# Patient Record
Sex: Male | Born: 1960 | Race: Black or African American | Hispanic: No | Marital: Single | Smoking: Never smoker
Health system: Southern US, Community
[De-identification: ages and names within clinical notes are randomized; demographics above are authoritative.]

## PROBLEM LIST (undated history)

## (undated) DIAGNOSIS — E119 Type 2 diabetes mellitus without complications: Secondary | ICD-10-CM

## (undated) DIAGNOSIS — K219 Gastro-esophageal reflux disease without esophagitis: Secondary | ICD-10-CM

## (undated) HISTORY — PX: ROTATOR CUFF REPAIR: SHX139

## (undated) HISTORY — PX: JOINT REPLACEMENT: SHX530

---

## 2001-12-07 ENCOUNTER — Ambulatory Visit (HOSPITAL_BASED_OUTPATIENT_CLINIC_OR_DEPARTMENT_OTHER): Admission: RE | Admit: 2001-12-07 | Discharge: 2001-12-07 | Payer: Self-pay | Admitting: Orthopedic Surgery

## 2012-02-06 ENCOUNTER — Ambulatory Visit: Payer: Self-pay | Admitting: Orthopedic Surgery

## 2012-02-06 LAB — CBC
HCT: 40.9 % (ref 40.0–52.0)
HGB: 13.6 g/dL (ref 13.0–18.0)
MCHC: 33.1 g/dL (ref 32.0–36.0)
Platelet: 261 10*3/uL (ref 150–440)
RBC: 4.4 10*6/uL (ref 4.40–5.90)

## 2012-02-06 LAB — MRSA PCR SCREENING

## 2012-02-06 LAB — BASIC METABOLIC PANEL
Calcium, Total: 9.1 mg/dL (ref 8.5–10.1)
Chloride: 105 mmol/L (ref 98–107)
Co2: 27 mmol/L (ref 21–32)
Potassium: 4.3 mmol/L (ref 3.5–5.1)
Sodium: 138 mmol/L (ref 136–145)

## 2012-02-06 LAB — PROTIME-INR
INR: 0.9
Prothrombin Time: 12.9 secs (ref 11.5–14.7)

## 2012-02-06 LAB — APTT: Activated PTT: 31.2 secs (ref 23.6–35.9)

## 2012-02-23 ENCOUNTER — Inpatient Hospital Stay: Payer: Self-pay | Admitting: Orthopedic Surgery

## 2012-02-24 LAB — BASIC METABOLIC PANEL
Anion Gap: 7 (ref 7–16)
BUN: 11 mg/dL (ref 7–18)
Co2: 23 mmol/L (ref 21–32)
Creatinine: 1.14 mg/dL (ref 0.60–1.30)
EGFR (African American): >60
EGFR (Non-African Amer.): >60
Osmolality: 275 (ref 275–301)
Sodium: 136 mmol/L (ref 136–145)

## 2012-02-24 LAB — PLATELET COUNT: Platelet: 185 10*3/uL (ref 150–440)

## 2013-09-12 ENCOUNTER — Ambulatory Visit: Payer: Self-pay | Admitting: Orthopedic Surgery

## 2013-09-12 LAB — BASIC METABOLIC PANEL
ANION GAP: 6 — AB (ref 7–16)
BUN: 12 mg/dL (ref 7–18)
CHLORIDE: 102 mmol/L (ref 98–107)
CREATININE: 1.19 mg/dL (ref 0.60–1.30)
Calcium, Total: 9.1 mg/dL (ref 8.5–10.1)
Co2: 29 mmol/L (ref 21–32)
EGFR (African American): >60
EGFR (Non-African Amer.): >60
Glucose: 175 mg/dL — ABNORMAL HIGH (ref 65–99)
Osmolality: 278 (ref 275–301)
Potassium: 4.2 mmol/L (ref 3.5–5.1)
SODIUM: 137 mmol/L (ref 136–145)

## 2013-09-12 LAB — URINALYSIS, COMPLETE
BACTERIA: NONE SEEN
BLOOD: NEGATIVE
Bilirubin,UR: NEGATIVE
Glucose,UR: NEGATIVE mg/dL (ref 0–75)
Ketone: NEGATIVE
LEUKOCYTE ESTERASE: NEGATIVE
Nitrite: NEGATIVE
PROTEIN: NEGATIVE
Ph: 5 (ref 4.5–8.0)
RBC,UR: <1 /HPF (ref 0–5)
SPECIFIC GRAVITY: 1.028 (ref 1.003–1.030)
WBC UR: 1 /HPF (ref 0–5)

## 2013-09-12 LAB — CBC
HCT: 40.4 % (ref 40.0–52.0)
HGB: 13.3 g/dL (ref 13.0–18.0)
MCH: 31.1 pg (ref 26.0–34.0)
MCHC: 32.9 g/dL (ref 32.0–36.0)
MCV: 95 fL (ref 80–100)
Platelet: 243 10*3/uL (ref 150–440)
RBC: 4.27 10*6/uL — AB (ref 4.40–5.90)
RDW: 13.1 % (ref 11.5–14.5)
WBC: 6.9 10*3/uL (ref 3.8–10.6)

## 2013-09-12 LAB — APTT: Activated PTT: 29.9 secs (ref 23.6–35.9)

## 2013-09-12 LAB — PROTIME-INR
INR: 1
Prothrombin Time: 12.6 secs (ref 11.5–14.7)

## 2013-09-12 LAB — MRSA PCR SCREENING

## 2013-09-12 LAB — SEDIMENTATION RATE: ERYTHROCYTE SED RATE: 12 mm/h (ref 0–20)

## 2013-09-24 ENCOUNTER — Inpatient Hospital Stay: Payer: Self-pay | Admitting: Orthopedic Surgery

## 2013-09-25 LAB — BASIC METABOLIC PANEL
ANION GAP: 7 (ref 7–16)
BUN: 11 mg/dL (ref 7–18)
CALCIUM: 8.6 mg/dL (ref 8.5–10.1)
Chloride: 100 mmol/L (ref 98–107)
Co2: 27 mmol/L (ref 21–32)
Creatinine: 1.33 mg/dL — ABNORMAL HIGH (ref 0.60–1.30)
EGFR (African American): >60
EGFR (Non-African Amer.): >60
Glucose: 164 mg/dL — ABNORMAL HIGH (ref 65–99)
Osmolality: 271 (ref 275–301)
Potassium: 4.2 mmol/L (ref 3.5–5.1)
SODIUM: 134 mmol/L — AB (ref 136–145)

## 2013-09-25 LAB — HEMOGLOBIN: HGB: 11.3 g/dL — ABNORMAL LOW (ref 13.0–18.0)

## 2013-09-25 LAB — PLATELET COUNT: PLATELETS: 221 10*3/uL (ref 150–440)

## 2013-09-26 LAB — BASIC METABOLIC PANEL
ANION GAP: 5 — AB (ref 7–16)
BUN: 11 mg/dL (ref 7–18)
CALCIUM: 8.4 mg/dL — AB (ref 8.5–10.1)
Chloride: 102 mmol/L (ref 98–107)
Co2: 30 mmol/L (ref 21–32)
Creatinine: 1.37 mg/dL — ABNORMAL HIGH (ref 0.60–1.30)
EGFR (African American): >60
EGFR (Non-African Amer.): 59 — ABNORMAL LOW
GLUCOSE: 137 mg/dL — AB (ref 65–99)
OSMOLALITY: 275 (ref 275–301)
POTASSIUM: 4.3 mmol/L (ref 3.5–5.1)
Sodium: 137 mmol/L (ref 136–145)

## 2013-09-26 LAB — HEMOGLOBIN: HGB: 11.7 g/dL — ABNORMAL LOW (ref 13.0–18.0)

## 2013-09-27 LAB — PATHOLOGY REPORT

## 2014-07-08 NOTE — Discharge Summary (Signed)
PATIENT NAME:  Frank [name], THACKSTON MR#:  [id number] DATE OF BIRTH:  1960-12-11  DATE OF ADMISSION:  02/23/2012 DATE OF DISCHARGE:  02/26/2012  ADMITTING DIAGNOSIS: Left hip severe osteoarthritis.   DISCHARGE DIAGNOSIS: Left hip severe osteoarthritis.   PROCEDURE: Left total hip replacement.   SURGEON:  Leitha Schuller, M.D.   ASSISTANT: Cranston Neighbor, PA-C   ANESTHESIA: Spinal.  IMPLANTS: Medacta Versa-Fit cup DM, size 50-mm with a 50-mm DM liner, small 28-mm femoral head, and a #2 standard amis stem, hydroxy apatite coated.   ESTIMATED BLOOD LOSS: 325 mL with 125 mL returned.   URINE OUTPUT: 250 mL.   COMPLICATIONS: None.   SPECIMENS: Removed femoral head.   HISTORY: The patient is a 54 year old male who has had bilateral hip pain going on for at least two years. Left hip pain was more symptomatic than the right. Over the last couple of years the patient's pain has progressed to the point where he has pain with rest as well as with activity. He has had no relief with oral pain medication and anti-inflammatory medications. Pain is located in the left groin. Pain could develop to 8-9/10 with activity. He has been awakened at night by the left hip pain.     PHYSICAL EXAMINATION: GENERAL: Well developed, well nourished male in no apparent distress. Normal affect. Normal touch component in bilateral lower extremities. HEENT: Head is normocephalic, atraumatic. Pupils equal, round, reactive to light. No dentures or partials. HEART: Regular rate and rhythm. There is no murmur. There is a normal apical pulse. LUNGS: Clear to auscultation. There is no wheeze, rhonchi, or crackles. There is normal expansion of bilateral chest walls. LEFT HIP: Examination of the left hip shows the patient has 5-10 degrees of internal rotation of the left hip with 10 degrees of external rotation. He has a slight flexion contracture of the left hip. He has discomfort with internal and external rotation as well as hip  flexion. He is neurovascularly intact in the left lower extremity.   HOSPITAL COURSE: The patient was admitted to the hospital on 02/23/2012. He had surgery that same day and was brought to the orthopedic floor from the PAC-U unit in stable condition. On postoperative day one the patient's vitals and labs were monitored and remained stable. He did progress very well on postoperative day one, walking over 300 feet. By postoperative day two and postoperative day three, the patient continued to progress with physical therapy. His pain was significantly improving. His vitals remained stable. He was ready for discharge home with home physical therapy on 02/26/2012.   CONDITION AT DISCHARGE: Stable.   DISCHARGE INSTRUCTIONS:  The patient may gradually increase weight-bearing on the affected extremity. He is to wear thigh-high TED hose on both legs and remove at bedtime and replace when arising the next morning.   DIET: . He may resume a regular diet as tolerated.   MEDICATIONS: He may resume typical home medications. He should take Xarelto 10 mg every morning for 32 days.   PAIN MEDICATIONS: He should use Tylenol 650 to 1000 mg every six hours as needed for pain as well as oxycodone 5 to 10 mg every four hours as needed for pain.   WOUND CARE:  He is to apply an ice pack to the affected area. Do not get the dressing or bandage wet or dirty. He is to call [facility] orthopedics if the dressing gets water under it. Leave the dressing on.   He should call [facility]  orthopedics if any of the following occur: Bright red bleeding from the incision wound, fever above 101.5 degrees, redness, swelling, or drainage at the incision. [facility] if he experiences any leg pain, numbness or weakness in the legs, or bowel or bladder symptoms.   The patient is referred for physical therapy. He is to [facility] if a therapist has not contacted him within 48 hours of  his return home.   FOLLOWUP: The patient has a [facility] in two weeks. He should call to schedule an appointment.   DISCHARGE MEDICATIONS:  1. Oxycodone 5 mg 1 to 2 tablets every 4 to 6 hours as needed for pain.  2. Tylenol 650 to 1000 mg every 6 hours as needed for pain.  3. Xarelto 10 mg every morning once a day for 32 days.    ____________________________ Evon Slackhomas C. Gaines, PA-C tcg:bjt D: 02/26/2012 09:55:17 ET T: 02/26/2012 12:17:51 ET JOB#: [id number]  cc: Evon Slackhomas C. Gaines, PA-C, <Dictator> Evon SlackHOMAS C GAINES PA ELECTRONICALLY SIGNED 03/01/2012 10:00

## 2014-07-08 NOTE — Op Note (Signed)
PATIENT NAME:  Frank [name], Frank [name] MR#:  [id number] DATE OF BIRTH:  1960/08/18  DATE OF PROCEDURE:  02/23/2012  PREOPERATIVE DIAGNOSIS: Left hip severe osteoarthritis.   POSTOPERATIVE DIAGNOSIS: Left hip severe osteoarthritis.   PROCEDURE: Left total hip replacement.   SURGEON: Frank BuckerMichael Merikay Lesniewski, MD  ASSISTANT: Frank Neighborhris Gaines, PA-[name]  ANESTHESIA: Spinal.   IMPLANTS: Medacta Versa Fit cup DM size 50 mm liner with a 50 mm DM liner, small 28 mm femoral head, and a #2 standard Amis stem, hydroxyapatite coated.   DESCRIPTION OF PROCEDURE: The patient was brought to the Operating Room and, after adequate anesthesia was obtained, the right leg was placed in a well legholder and left leg in Medacta traction boot. The [name]-arm was brought in and initial x-rays obtained for subsequent comparison. After prepping and draping in the usual sterile manner, anterior approach was made lateral to the greater trochanter. The skin and subcutaneous tissue was incised followed by the fascia overlying the tensor. The tensor was retracted laterally and the deep fascia incised. The rectus fascia was then identified and incised and the rectus retracted medially. Femoral circumflex vessel was then coagulated. The anterior capsule was then exposed with a capsulotomy created with capsule flap lateral for subsequent placement of the modified Charnley retractor. The femoral neck cut was carried out at this point and the femoral head was removed. There was significant deformity found of the femoral head with extensive osteoarthritis. The acetabulum had similar appearance. The labrum was excised and sequential reaming carried out to 48 then 50 mm, which appeared appropriate. The 50 mm trial fit well and the 50 mm implant was impacted into place and was stable. Next, the posterior capsule was partially released and the leg brought in extension and external rotation. The lateral aspect of the trochanter was opened with a small box osteotome and  curette and sequential broaching was carried out up to a #2, which had a very tight fit. With the #2 trial being placed, component selection was carried out. The #2 stem was inserted down the canal with a short head and bipolar DM head, which was assembled on the back table. The hip was thoroughly irrigated and then reduced. X-rays showed good component position and equal leg lengths. The wound was again thoroughly irrigated and hemostasis checked with electrocautery. The tensor fascia was repaired with a heavy quill suture after infiltration of the soft tissues with a combination of morphine, Toradol, and 0.25% Sensorcaine with epinephrine, along with saline for dilution. A subcutaneous drain was inserted, 2-0 subcutaneous quill suture and skin staples. Xeroform, 4 x 4's, ABD, and tape were applied. The patient was sent to the recovery room in stable condition.   ESTIMATED BLOOD LOSS: 325 mL with 125 mL returned.   URINE OUTPUT: 250 mL.   COMPLICATIONS: None.   SPECIMEN: Removed femoral head. ____________________________ Frank [name]. [name], MD mjm:slb D: 02/23/2012 21:17:38 ET T: 02/24/2012 07:43:42 ET JOB#: [id number]  cc: Frank [name]. [name], MD, <Dictator> Frank [name] Frank Avino MD ELECTRONICALLY SIGNED 02/24/2012 8:26

## 2014-07-12 NOTE — Op Note (Signed)
PATIENT NAME:  Frank [name], Frank [name] MR#:  [id number] DATE OF BIRTH:  26-Jun-1960  DATE OF PROCEDURE:  09/24/2013  PREOPERATIVE DIAGNOSIS: Avascular necrosis and osteoarthritis, right hip.   POSTOPERATIVE DIAGNOSIS:  Avascular necrosis and osteoarthritis, right hip.   PROCEDURE: Right total hip replacement, anterior approach.   SURGEON:  Kennedy BuckerMichael Dmoni Fortson, MD   ASSISTANT:  Dedra Skeensodd Mundy   ANESTHESIA: Spinal.   DESCRIPTION OF PROCEDURE: The patient was brought to the operating room, and after adequate anesthesia was obtained, the patient was placed on the operative table with the left leg on a well-padded table, right foot in the Medacta attachment. [name]-arm was brought in and good visualization of the hip was obtained along with a preop x-ray. After prepping and draping in the usual sterile manner, appropriate patient identification and timeout procedures were completed. Incision was centered over the tensor fascia lata muscle and the greater trochanter.  Incision was carried down through the skin and subcutaneous tissue to the TFL fascia which was incised. The muscle was retracted laterally and deep retractor placed. The deep fascia was then incised, as well as the quadriceps fascia. The lateral femoral vessels were identified and ligated. The anterior capsule was then exposed and a capsulotomy created superolaterally.  Hohmann retractors were placed around the femoral neck and a femoral neck cut carried out with removal of the head. The head showed complete loss of articular cartilage over most of its surface. The labrum was excised. The acetabulum had significant degenerative changes as well. The acetabulum was then reamed up to 52 mm, at which point there was good bleeding bone. A 52 mm trial fit well and a 52 mm cup was impacted into position.  It was quite stable and appropriately placed. Next, the leg was externally rotated, pubofemoral and ischiofemoral releases carried out to allow for adequate external  rotation, and the leg was dropped into extension and adduction. Sequential broaching was carried out and trials made. The size 2 stem fit very well and this was the size on his opposite side. With trials, the medium gave the best fit. With lateral offset to restore offset with the more medialized acetabulum. The final components were assembled with the 2 laterals Amis stem inserted, followed by the M28 head with liner for the 52 mm Versafit cup DM. The components were impacted and the hip reduced. It was stable to 90 degrees external rotation, and postimplantation, [name]-arm view showed slightly increased length consistent with adding length secondary to his arthritis with appropriate offset. The wound was thoroughly irrigated. Then, 30 mL of 0.25% Sensorcaine with epinephrine was infiltrated into the incision. The tensor was repaired using a heavy quill, 2-0 quill subcutaneously, and staples closed the skin. Xeroform, 4 x 4's, ABD, and tape applied. The patient was sent to the recovery room in stable condition.   ESTIMATED BLOOD LOSS: 300 mL.   COMPLICATIONS: None.   SPECIMENS: Femoral head.   ____________________________ Leitha SchullerMichael J. Medrith Veillon, MD mjm:dd D: 09/24/2013 19:00:38 ET T: 09/24/2013 21:03:34 ET JOB#: [id number]  cc: Leitha SchullerMichael J. Chanley Mcenery, MD, <Dictator> Leitha SchullerMICHAEL J Genasis Zingale MD ELECTRONICALLY SIGNED 09/25/2013 6:08

## 2014-07-12 NOTE — Discharge Summary (Signed)
PATIENT NAME:  Frank [name], [name] MR#:  [id number] DATE OF BIRTH:  1960-11-08  DATE OF ADMISSION:  09/24/2013 DATE OF DISCHARGE:  09/27/2013  ADMITTING DIAGNOSIS: Avascular necrosis with osteoarthritis of the right hip.   DISCHARGE DIAGNOSIS: Avascular necrosis with osteoarthritis of the right hip.   OPERATION: On 09/24/2013, the patient had right total hip replacement using anterior approach.   SURGEON: Kennedy BuckerMichael Menz, M.D.   ASSISTANT: Dedra Skeensodd Atilla Zollner.   ANESTHESIA: Spinal.   ESTIMATED BLOOD LOSS: 300 mL.   COMPLICATIONS: None.   IMPLANTS USED: Two lateral Amis stems with M-28 head with liner for the 52 mm Versafit cup DM.  The patient was stabilized, brought to the recovery room, and then brought down to the orthopedic floor.   HISTORY: The patient is a 54 year old male who presented for continued pain involving his right hip. The patient has avascular necrosis and has been trying conservative management but has failed this. The patient has tried anti-inflammatories and activity modification. The patient is status post left total hip replacement done by Dr. Rosita KeaMenz on February 23, 2012 and has done quite well.   PHYSICAL EXAMINATION: GENERAL: Alert male with an antalgic gait on the right side with difficulties with transfers.  CARDIOVASCULAR: Regular rate and rhythm.  LUNGS: Clear to auscultation bilaterally.  MUSCULOSKELETAL: In regard to the right lower extremity, the patient has no gross deformity. The patient does have range of motion that is limited to 5 degrees external rotation and 3 degrees internal rotation with 70 degrees of flexion producing pain. The patient has weakness with 4/5 strength with straight leg raises and knee flexion.   HOSPITAL COURSE: After initial admission on September 24, 2013, the patient was brought to the orthopedic floor. On postop day 1, the patient's hemoglobin was at 11.3, went up to 11.7 on postop day 2. The patient did have a low-grade fever which was treated  with Tylenol. The patient seemed to break through this by postop day 3 and was ready to go home after doing physical therapy and ambulating 125 feet as well as doing the stairs.   DISCHARGE INSTRUCTIONS: The patient will [facility] in 10 days for staple removal. The patient will do physical therapy at home doing gait training and range of motion activity. The patient is weight-bear as tolerated. The patient's diet is regular.  The patient will try to keep his bandage on and have it changed as needed with physical therapy. The patient will use ice if there is any swelling. The patient will call the clinic if there is any bright red bleeding, calf pain, bowel or bladder difficulty, or any fever greater than 101.5.   DISCHARGE MEDICATIONS: Resume home medications and add Lovenox 40 mg subcu once a day for 14 days and then for pain control he will use oxycodone 5 mg 1 to 2 tablets q. 4 hours as needed for severe pain.  ____________________________ Shela CommonsJ. Dedra Skeensodd Sheyanne Munley, [place] jtm:sb D: 09/27/2013 07:07:23 ET T: 09/27/2013 07:22:22 ET JOB#: [id number]  cc: J. Dedra Skeensodd Ressie Slevin, [place]PA, <Dictator> J Yaniel Limbaugh [facility] PA ELECTRONICALLY SIGNED 10/05/2013 7:11

## 2020-05-22 ENCOUNTER — Other Ambulatory Visit: Payer: Self-pay | Admitting: Orthopedic Surgery

## 2020-06-08 ENCOUNTER — Encounter
Admission: RE | Admit: 2020-06-08 | Discharge: 2020-06-08 | Disposition: A | Discharge status: Home / Self Care | Payer: BC Managed Care – PPO | Source: Ambulatory Visit | Attending: Orthopedic Surgery | Admitting: Orthopedic Surgery

## 2020-06-08 ENCOUNTER — Other Ambulatory Visit: Payer: Self-pay

## 2020-06-08 HISTORY — DX: Gastro-esophageal reflux disease without esophagitis: K21.9

## 2020-06-08 NOTE — Patient Instructions (Signed)
Your procedure is scheduled on: Tuesday 06/16/20.  Report to THE FIRST FLOOR REGISTRATION DESK IN THE MEDICAL MALL ON THE MORNING OF SURGERY FIRST, THEN YOU WILL CHECK IN AT THE SURGERY INFORMATION DESK LOCATED OUTSIDE THE SAME DAY SURGERY DEPARTMENT LOCATED ON 2ND FLOOR MEDICAL MALL ENTRANCE.  To find out your arrival time please call [phone] between 1PM - 3PM on Monday 06/15/20.   Remember: Instructions that are not followed completely may result in serious medical risk, up to and including death, or upon the discretion of your surgeon and anesthesiologist your surgery may need to be rescheduled.     __X__ 1. Do not eat food after midnight the night before your procedure.                 No gum chewing or hard candies. You may drink SUGAR FREE clear liquids up to 2 hours                 before you are scheduled to arrive for your surgery- DO NOT drink clear                 liquids within 2 hours of the start of your surgery.                  __X__2.  On the morning of surgery brush your teeth with toothpaste and water, you may rinse your mouth with mouthwash if you wish.  Do not swallow any toothpaste or mouthwash.    __X__ 3.  No Alcohol for 24 hours before or after surgery.  __X__ 4.  Do Not Smoke or use e-cigarettes For 24 Hours Prior to Your Surgery.                 Do not use any chewable tobacco products for at least 6 hours prior to                 surgery.  __X__5.  Notify your doctor if there is any change in your medical condition      (cold, fever, infections).      Do NOT wear jewelry, make-up, hairpins, clips or nail polish. Do NOT wear lotions, powders, or perfumes.  Do NOT shave 48 hours prior to surgery. Men may shave face and neck. Do NOT bring valuables to the hospital.     [facility] is not responsible for any belongings or valuables.   Contacts, dentures/partials or body piercings may not be worn into surgery. Bring a case for your contacts, glasses or  hearing aids, a denture cup will be supplied.  Leave your suitcase in the car. After surgery it may be brought to your room.   For patients admitted to the hospital, discharge time is determined by your treatment team.   __X__ Take these medicines the morning of surgery with A SIP OF WATER:     1. NONE      __X__ Use CHG Soap as directed.  __X__ Stop Metformin 2 days prior to surgery. Your last dose will be on Saturday 06/13/20.    __X__ Stop Blood Thinners Coumadin Aspirin today.  __X__ Stop Anti-inflammatories 7 days before surgery such as Advil, Ibuprofen, Motrin, BC or Goodies Powder, Naprosyn, Naproxen, Aleve, Aspirin, Meloxicam. May take Tylenol if needed for pain or discomfort.   __X__Do not start taking any new herbal supplements or vitamins prior to your procedure.     Wear comfortable clothing (specific to your surgery type) to the hospital.  Plan for stool softeners for home use; pain medications have a tendency to cause constipation. You can also help prevent constipation by eating foods high in fiber such as fruits and vegetables and drinking plenty of fluids as your diet allows.  After surgery, you can prevent lung complications by doing breathing exercises.Take deep breaths and cough every 1-2 hours. Your doctor may order a device called an Incentive Spirometer to help you take deep breaths.  Please call the Pre-Admissions Testing Department at [phone] if you have any questions about these instructions.

## 2020-06-12 ENCOUNTER — Other Ambulatory Visit
Admission: RE | Admit: 2020-06-12 | Discharge: 2020-06-12 | Disposition: A | Discharge status: Home / Self Care | Payer: BC Managed Care – PPO | Source: Ambulatory Visit | Attending: Orthopedic Surgery | Admitting: Orthopedic Surgery

## 2020-06-12 ENCOUNTER — Other Ambulatory Visit: Payer: Self-pay

## 2020-06-12 DIAGNOSIS — Z20822 Contact with and (suspected) exposure to covid-19: Secondary | ICD-10-CM | POA: Insufficient documentation

## 2020-06-12 DIAGNOSIS — Z01818 Encounter for other preprocedural examination: Secondary | ICD-10-CM | POA: Diagnosis not present

## 2020-06-12 LAB — COMPREHENSIVE METABOLIC PANEL
ALT: 26 U/L (ref 0–44)
AST: 24 U/L (ref 15–41)
Albumin: 4.2 g/dL (ref 3.5–5.0)
Alkaline Phosphatase: 54 U/L (ref 38–126)
Anion gap: 8 (ref 5–15)
BUN: 14 mg/dL (ref 6–20)
CO2: 26 mmol/L (ref 22–32)
Calcium: 9.3 mg/dL (ref 8.9–10.3)
Chloride: 101 mmol/L (ref 98–111)
Creatinine, Ser: 1.06 mg/dL (ref 0.61–1.24)
GFR, Estimated: >60 mL/min (ref >60)
Glucose, Bld: 206 mg/dL — ABNORMAL HIGH (ref 70–99)
Potassium: 4.4 mmol/L (ref 3.5–5.1)
Sodium: 135 mmol/L (ref 135–145)
Total Bilirubin: 1 mg/dL (ref 0.3–1.2)
Total Protein: 7.4 g/dL (ref 6.5–8.1)

## 2020-06-12 LAB — CBC WITH DIFFERENTIAL/PLATELET
Abs Immature Granulocytes: 0.01 10*3/uL (ref 0.00–0.07)
Basophils Absolute: 0 10*3/uL (ref 0.0–0.1)
Basophils Relative: 0 %
Eosinophils Absolute: 0.1 10*3/uL (ref 0.0–0.5)
Eosinophils Relative: 1 %
HCT: 39.5 % (ref 39.0–52.0)
Hemoglobin: 12.9 g/dL — ABNORMAL LOW (ref 13.0–17.0)
Immature Granulocytes: 0 %
Lymphocytes Relative: 22 %
Lymphs Abs: 1.4 10*3/uL (ref 0.7–4.0)
MCH: 30.8 pg (ref 26.0–34.0)
MCHC: 32.7 g/dL (ref 30.0–36.0)
MCV: 94.3 fL (ref 80.0–100.0)
Monocytes Absolute: 0.4 10*3/uL (ref 0.1–1.0)
Monocytes Relative: 6 %
Neutro Abs: 4.4 10*3/uL (ref 1.7–7.7)
Neutrophils Relative %: 71 %
Platelets: 258 10*3/uL (ref 150–400)
RBC: 4.19 MIL/uL — ABNORMAL LOW (ref 4.22–5.81)
RDW: 11.8 % (ref 11.5–15.5)
WBC: 6.3 10*3/uL (ref 4.0–10.5)
nRBC: 0 % (ref 0.0–0.2)

## 2020-06-12 LAB — URINALYSIS, ROUTINE W REFLEX MICROSCOPIC
Bilirubin Urine: NEGATIVE
Glucose, UA: NEGATIVE mg/dL
Hgb urine dipstick: NEGATIVE
Ketones, ur: NEGATIVE mg/dL
Leukocytes,Ua: NEGATIVE
Nitrite: NEGATIVE
Protein, ur: NEGATIVE mg/dL
Specific Gravity, Urine: 1.01 (ref 1.005–1.030)
pH: 5 (ref 5.0–8.0)

## 2020-06-12 LAB — SARS CORONAVIRUS 2 (TAT 6-24 HRS): SARS Coronavirus 2: NEGATIVE

## 2020-06-12 LAB — SURGICAL PCR SCREEN
MRSA, PCR: NEGATIVE
Staphylococcus aureus: NEGATIVE

## 2020-06-13 LAB — TYPE AND SCREEN
ABO/RH(D): A POS
Antibody Screen: NEGATIVE

## 2020-06-15 MED ORDER — SODIUM CHLORIDE 0.9 % IV SOLN: INTRAVENOUS | Status: DC

## 2020-06-15 MED ORDER — FAMOTIDINE 20 MG PO TABS: 20.0000 mg | ORAL_TABLET | Freq: Once | ORAL | Status: AC

## 2020-06-15 MED ORDER — CEFAZOLIN SODIUM-DEXTROSE 2-4 GM/100ML-% IV SOLN
2.0000 g | INTRAVENOUS | Status: AC
  Administered 2020-06-16: 2 g via INTRAVENOUS

## 2020-06-15 MED ORDER — ORAL CARE MOUTH RINSE: 15.0000 mL | Freq: Once | OROMUCOSAL | Status: AC

## 2020-06-15 MED ORDER — CHLORHEXIDINE GLUCONATE 0.12 % MT SOLN: 15.0000 mL | Freq: Once | OROMUCOSAL | Status: AC

## 2020-06-16 ENCOUNTER — Encounter
Admission: RE | Disposition: A | Discharge status: Home Health | Payer: Self-pay | Source: Home / Self Care | Attending: Orthopedic Surgery

## 2020-06-16 ENCOUNTER — Encounter: Payer: Self-pay | Admitting: Orthopedic Surgery

## 2020-06-16 ENCOUNTER — Inpatient Hospital Stay: Payer: BC Managed Care – PPO

## 2020-06-16 ENCOUNTER — Inpatient Hospital Stay
Admission: RE | Admit: 2020-06-16 | Discharge: 2020-06-17 | DRG: 468 | Disposition: A | Discharge status: Home Health | Payer: BC Managed Care – PPO | Attending: Orthopedic Surgery | Admitting: Orthopedic Surgery

## 2020-06-16 ENCOUNTER — Other Ambulatory Visit: Payer: Self-pay

## 2020-06-16 DIAGNOSIS — G8918 Other acute postprocedural pain: Secondary | ICD-10-CM

## 2020-06-16 DIAGNOSIS — Z800 Family history of malignant neoplasm of digestive organs: Secondary | ICD-10-CM

## 2020-06-16 DIAGNOSIS — K219 Gastro-esophageal reflux disease without esophagitis: Secondary | ICD-10-CM | POA: Diagnosis present

## 2020-06-16 DIAGNOSIS — T84030A Mechanical loosening of internal right hip prosthetic joint, initial encounter: Secondary | ICD-10-CM | POA: Diagnosis present

## 2020-06-16 DIAGNOSIS — Z820 Family history of epilepsy and other diseases of the nervous system: Secondary | ICD-10-CM | POA: Diagnosis not present

## 2020-06-16 DIAGNOSIS — Z419 Encounter for procedure for purposes other than remedying health state, unspecified: Secondary | ICD-10-CM

## 2020-06-16 DIAGNOSIS — Z841 Family history of disorders of kidney and ureter: Secondary | ICD-10-CM | POA: Diagnosis not present

## 2020-06-16 DIAGNOSIS — Z833 Family history of diabetes mellitus: Secondary | ICD-10-CM | POA: Diagnosis not present

## 2020-06-16 DIAGNOSIS — E119 Type 2 diabetes mellitus without complications: Secondary | ICD-10-CM | POA: Diagnosis present

## 2020-06-16 DIAGNOSIS — Z7984 Long term (current) use of oral hypoglycemic drugs: Secondary | ICD-10-CM | POA: Diagnosis not present

## 2020-06-16 DIAGNOSIS — Y792 Prosthetic and other implants, materials and accessory orthopedic devices associated with adverse incidents: Secondary | ICD-10-CM | POA: Diagnosis present

## 2020-06-16 DIAGNOSIS — Z96642 Presence of left artificial hip joint: Secondary | ICD-10-CM | POA: Diagnosis present

## 2020-06-16 DIAGNOSIS — Z96649 Presence of unspecified artificial hip joint: Secondary | ICD-10-CM

## 2020-06-16 HISTORY — DX: Type 2 diabetes mellitus without complications: E11.9

## 2020-06-16 HISTORY — PX: TOTAL HIP REVISION: SHX763

## 2020-06-16 LAB — CBC
HCT: 36.3 % — ABNORMAL LOW (ref 39.0–52.0)
Hemoglobin: 11.8 g/dL — ABNORMAL LOW (ref 13.0–17.0)
MCH: 30.6 pg (ref 26.0–34.0)
MCHC: 32.5 g/dL (ref 30.0–36.0)
MCV: 94.3 fL (ref 80.0–100.0)
Platelets: 234 10*3/uL (ref 150–400)
RBC: 3.85 MIL/uL — ABNORMAL LOW (ref 4.22–5.81)
RDW: 11.9 % (ref 11.5–15.5)
WBC: 10.6 10*3/uL — ABNORMAL HIGH (ref 4.0–10.5)
nRBC: 0 % (ref 0.0–0.2)

## 2020-06-16 LAB — CREATININE, SERUM
Creatinine, Ser: 0.96 mg/dL (ref 0.61–1.24)
GFR, Estimated: >60 mL/min (ref >60)

## 2020-06-16 LAB — GLUCOSE, CAPILLARY
Glucose-Capillary: 166 mg/dL — ABNORMAL HIGH (ref 70–99)
Glucose-Capillary: 214 mg/dL — ABNORMAL HIGH (ref 70–99)
Glucose-Capillary: 212 mg/dL — ABNORMAL HIGH (ref 70–99)

## 2020-06-16 LAB — HEMOGLOBIN A1C
Hgb A1c MFr Bld: 6.4 % — ABNORMAL HIGH (ref 4.8–5.6)
Mean Plasma Glucose: 136.98 mg/dL

## 2020-06-16 LAB — ABO/RH: ABO/RH(D): A POS

## 2020-06-16 SURGERY — TOTAL HIP REVISION
Anesthesia: General | Site: Hip | Laterality: Right

## 2020-06-16 MED ORDER — SODIUM CHLORIDE 0.9 % IV SOLN
INTRAVENOUS | Status: DC | PRN
  Administered 2020-06-16: 60 mL

## 2020-06-16 MED ORDER — HYDROMORPHONE HCL 1 MG/ML IJ SOLN
INTRAMUSCULAR | Status: DC | PRN
  Administered 2020-06-16: .5 mg via INTRAVENOUS

## 2020-06-16 MED ORDER — CEFAZOLIN SODIUM-DEXTROSE 2-4 GM/100ML-% IV SOLN
2.0000 g | Freq: Four times a day (QID) | INTRAVENOUS | Status: AC
  Administered 2020-06-16 (×2): 2 g via INTRAVENOUS
  Filled 2020-06-16 (×2): qty 100

## 2020-06-16 MED ORDER — ONDANSETRON HCL 4 MG PO TABS: 4.0000 mg | ORAL_TABLET | Freq: Four times a day (QID) | ORAL | Status: DC | PRN

## 2020-06-16 MED ORDER — OXYCODONE HCL 5 MG PO TABS
5.0000 mg | ORAL_TABLET | ORAL | Status: DC | PRN
  Administered 2020-06-16: 5 mg via ORAL
  Filled 2020-06-16: qty 1

## 2020-06-16 MED ORDER — FENTANYL CITRATE (PF) 100 MCG/2ML IJ SOLN
INTRAMUSCULAR | Status: AC
  Filled 2020-06-16: qty 2

## 2020-06-16 MED ORDER — METFORMIN HCL 500 MG PO TABS
500.0000 mg | ORAL_TABLET | Freq: Every day | ORAL | Status: DC
  Administered 2020-06-17: 500 mg via ORAL
  Filled 2020-06-16: qty 1

## 2020-06-16 MED ORDER — METHOCARBAMOL 500 MG PO TABS: 500.0000 mg | ORAL_TABLET | Freq: Four times a day (QID) | ORAL | Status: DC | PRN

## 2020-06-16 MED ORDER — PROPOFOL 10 MG/ML IV BOLUS
INTRAVENOUS | Status: AC
  Filled 2020-06-16: qty 20

## 2020-06-16 MED ORDER — MIDAZOLAM HCL 2 MG/2ML IJ SOLN
INTRAMUSCULAR | Status: AC
  Filled 2020-06-16: qty 2

## 2020-06-16 MED ORDER — BUPIVACAINE-EPINEPHRINE (PF) 0.25% -1:200000 IJ SOLN
INTRAMUSCULAR | Status: DC | PRN
  Administered 2020-06-16: 30 mL

## 2020-06-16 MED ORDER — SODIUM CHLORIDE 0.9 % IV SOLN
INTRAVENOUS | Status: DC
  Administered 2020-06-16: 1000 mL via INTRAVENOUS

## 2020-06-16 MED ORDER — ZOLPIDEM TARTRATE 5 MG PO TABS: 5.0000 mg | ORAL_TABLET | Freq: Every evening | ORAL | Status: DC | PRN

## 2020-06-16 MED ORDER — METHOCARBAMOL 1000 MG/10ML IJ SOLN
500.0000 mg | Freq: Four times a day (QID) | INTRAVENOUS | Status: DC | PRN
  Filled 2020-06-16: qty 5

## 2020-06-16 MED ORDER — FENTANYL CITRATE (PF) 100 MCG/2ML IJ SOLN
INTRAMUSCULAR | Status: AC
  Administered 2020-06-16: 25 ug via INTRAVENOUS
  Filled 2020-06-16: qty 2

## 2020-06-16 MED ORDER — ACETAMINOPHEN 10 MG/ML IV SOLN
INTRAVENOUS | Status: AC
  Filled 2020-06-16: qty 100

## 2020-06-16 MED ORDER — ATORVASTATIN CALCIUM 20 MG PO TABS
40.0000 mg | ORAL_TABLET | Freq: Every day | ORAL | Status: DC
  Administered 2020-06-17: 40 mg via ORAL
  Filled 2020-06-16: qty 2

## 2020-06-16 MED ORDER — ONDANSETRON HCL 4 MG/2ML IJ SOLN
INTRAMUSCULAR | Status: AC
  Filled 2020-06-16: qty 2

## 2020-06-16 MED ORDER — HYDROMORPHONE HCL 1 MG/ML IJ SOLN
INTRAMUSCULAR | Status: AC
  Filled 2020-06-16: qty 1

## 2020-06-16 MED ORDER — MAGNESIUM HYDROXIDE 400 MG/5ML PO SUSP: 30.0000 mL | Freq: Every day | ORAL | Status: DC | PRN

## 2020-06-16 MED ORDER — ROCURONIUM BROMIDE 100 MG/10ML IV SOLN
INTRAVENOUS | Status: DC | PRN
  Administered 2020-06-16: 10 mg via INTRAVENOUS
  Administered 2020-06-16: 30 mg via INTRAVENOUS

## 2020-06-16 MED ORDER — FAMOTIDINE 20 MG PO TABS
ORAL_TABLET | ORAL | Status: AC
  Administered 2020-06-16: 20 mg via ORAL
  Filled 2020-06-16: qty 1

## 2020-06-16 MED ORDER — ONDANSETRON HCL 4 MG/2ML IJ SOLN
INTRAMUSCULAR | Status: DC | PRN
  Administered 2020-06-16: 4 mg via INTRAVENOUS

## 2020-06-16 MED ORDER — METOCLOPRAMIDE HCL 5 MG/ML IJ SOLN: 5.0000 mg | Freq: Three times a day (TID) | INTRAMUSCULAR | Status: DC | PRN

## 2020-06-16 MED ORDER — OXYCODONE HCL 5 MG PO TABS: 5.0000 mg | ORAL_TABLET | Freq: Once | ORAL | Status: DC | PRN

## 2020-06-16 MED ORDER — DIPHENHYDRAMINE HCL 12.5 MG/5ML PO ELIX
12.5000 mg | ORAL_SOLUTION | ORAL | Status: DC | PRN
  Filled 2020-06-16: qty 10

## 2020-06-16 MED ORDER — MIDAZOLAM HCL 2 MG/2ML IJ SOLN
INTRAMUSCULAR | Status: DC | PRN
  Administered 2020-06-16: 2 mg via INTRAVENOUS

## 2020-06-16 MED ORDER — PANTOPRAZOLE SODIUM 40 MG PO TBEC
40.0000 mg | DELAYED_RELEASE_TABLET | Freq: Every day | ORAL | Status: DC
  Administered 2020-06-17: 40 mg via ORAL
  Filled 2020-06-16: qty 1

## 2020-06-16 MED ORDER — FENTANYL CITRATE (PF) 100 MCG/2ML IJ SOLN
25.0000 ug | INTRAMUSCULAR | Status: AC | PRN
  Administered 2020-06-16 (×4): 25 ug via INTRAVENOUS

## 2020-06-16 MED ORDER — ALUM & MAG HYDROXIDE-SIMETH 200-200-20 MG/5ML PO SUSP: 30.0000 mL | ORAL | Status: DC | PRN

## 2020-06-16 MED ORDER — PROPOFOL 10 MG/ML IV BOLUS
INTRAVENOUS | Status: DC | PRN
  Administered 2020-06-16: 150 mg via INTRAVENOUS

## 2020-06-16 MED ORDER — TRANEXAMIC ACID-NACL 1000-0.7 MG/100ML-% IV SOLN
INTRAVENOUS | Status: AC
  Administered 2020-06-16: 1000 mg via INTRAVENOUS
  Filled 2020-06-16: qty 100

## 2020-06-16 MED ORDER — INFLUENZA VAC SPLIT QUAD 0.5 ML IM SUSY
0.5000 mL | PREFILLED_SYRINGE | INTRAMUSCULAR | Status: DC
  Filled 2020-06-16 (×2): qty 0.5

## 2020-06-16 MED ORDER — OXYCODONE HCL 5 MG PO TABS
10.0000 mg | ORAL_TABLET | ORAL | Status: DC | PRN
Start: 2020-06-16 — End: 2020-06-17

## 2020-06-16 MED ORDER — TRANEXAMIC ACID-NACL 1000-0.7 MG/100ML-% IV SOLN: 1000.0000 mg | Freq: Once | INTRAVENOUS | Status: AC

## 2020-06-16 MED ORDER — HYDROMORPHONE HCL 1 MG/ML IJ SOLN: 0.5000 mg | INTRAMUSCULAR | Status: DC | PRN

## 2020-06-16 MED ORDER — CHLORHEXIDINE GLUCONATE 0.12 % MT SOLN
OROMUCOSAL | Status: AC
  Administered 2020-06-16: 15 mL via OROMUCOSAL
  Filled 2020-06-16: qty 15

## 2020-06-16 MED ORDER — KETAMINE HCL 10 MG/ML IJ SOLN
INTRAMUSCULAR | Status: DC | PRN
  Administered 2020-06-16: 20 mg via INTRAVENOUS
  Administered 2020-06-16: 30 mg via INTRAVENOUS

## 2020-06-16 MED ORDER — SUCCINYLCHOLINE CHLORIDE 20 MG/ML IJ SOLN
INTRAMUSCULAR | Status: DC | PRN
  Administered 2020-06-16: 100 mg via INTRAVENOUS

## 2020-06-16 MED ORDER — ACETAMINOPHEN 500 MG PO TABS
1000.0000 mg | ORAL_TABLET | Freq: Four times a day (QID) | ORAL | Status: AC
  Administered 2020-06-16 – 2020-06-17 (×4): 1000 mg via ORAL
  Filled 2020-06-16 (×5): qty 2

## 2020-06-16 MED ORDER — METOCLOPRAMIDE HCL 10 MG PO TABS: 5.0000 mg | ORAL_TABLET | Freq: Three times a day (TID) | ORAL | Status: DC | PRN

## 2020-06-16 MED ORDER — ACETAMINOPHEN 10 MG/ML IV SOLN
INTRAVENOUS | Status: DC | PRN
  Administered 2020-06-16: 1000 mg via INTRAVENOUS

## 2020-06-16 MED ORDER — KETAMINE HCL 50 MG/5ML IJ SOSY
PREFILLED_SYRINGE | INTRAMUSCULAR | Status: AC
  Filled 2020-06-16: qty 5

## 2020-06-16 MED ORDER — PHENYLEPHRINE HCL (PRESSORS) 10 MG/ML IV SOLN
INTRAVENOUS | Status: DC | PRN
  Administered 2020-06-16: 100 ug via INTRAVENOUS

## 2020-06-16 MED ORDER — FENTANYL CITRATE (PF) 100 MCG/2ML IJ SOLN
INTRAMUSCULAR | Status: DC | PRN
  Administered 2020-06-16 (×4): 50 ug via INTRAVENOUS

## 2020-06-16 MED ORDER — SUGAMMADEX SODIUM 200 MG/2ML IV SOLN
INTRAVENOUS | Status: DC | PRN
  Administered 2020-06-16: 200 mg via INTRAVENOUS

## 2020-06-16 MED ORDER — ONDANSETRON HCL 4 MG/2ML IJ SOLN: 4.0000 mg | Freq: Once | INTRAMUSCULAR | Status: DC | PRN

## 2020-06-16 MED ORDER — DOCUSATE SODIUM 100 MG PO CAPS
100.0000 mg | ORAL_CAPSULE | Freq: Two times a day (BID) | ORAL | Status: DC
  Administered 2020-06-16 – 2020-06-17 (×2): 100 mg via ORAL
  Filled 2020-06-16 (×2): qty 1

## 2020-06-16 MED ORDER — LIDOCAINE HCL (CARDIAC) PF 100 MG/5ML IV SOSY
PREFILLED_SYRINGE | INTRAVENOUS | Status: DC | PRN
  Administered 2020-06-16: 100 mg via INTRAVENOUS

## 2020-06-16 MED ORDER — MAGNESIUM CITRATE PO SOLN
1.0000 | Freq: Once | ORAL | Status: DC | PRN
  Filled 2020-06-16: qty 296

## 2020-06-16 MED ORDER — CEFAZOLIN SODIUM-DEXTROSE 2-4 GM/100ML-% IV SOLN
INTRAVENOUS | Status: AC
  Filled 2020-06-16: qty 100

## 2020-06-16 MED ORDER — INSULIN ASPART 100 UNIT/ML ~~LOC~~ SOLN: 0.0000 [IU] | Freq: Three times a day (TID) | SUBCUTANEOUS | Status: DC

## 2020-06-16 MED ORDER — ONDANSETRON HCL 4 MG/2ML IJ SOLN: 4.0000 mg | Freq: Four times a day (QID) | INTRAMUSCULAR | Status: DC | PRN

## 2020-06-16 MED ORDER — PHENOL 1.4 % MT LIQD
1.0000 | OROMUCOSAL | Status: DC | PRN
  Filled 2020-06-16: qty 177

## 2020-06-16 MED ORDER — ACETAMINOPHEN 325 MG PO TABS: 325.0000 mg | ORAL_TABLET | Freq: Four times a day (QID) | ORAL | Status: DC | PRN

## 2020-06-16 MED ORDER — DEXAMETHASONE SODIUM PHOSPHATE 10 MG/ML IJ SOLN
INTRAMUSCULAR | Status: DC | PRN
  Administered 2020-06-16: 5 mg via INTRAVENOUS

## 2020-06-16 MED ORDER — TRAMADOL HCL 50 MG PO TABS
50.0000 mg | ORAL_TABLET | Freq: Four times a day (QID) | ORAL | Status: DC
Start: 2020-06-16 — End: 2020-06-17
  Administered 2020-06-16 – 2020-06-17 (×4): 50 mg via ORAL
  Filled 2020-06-16 (×5): qty 1

## 2020-06-16 MED ORDER — NEOMYCIN-POLYMYXIN B GU 40-200000 IR SOLN
Status: DC | PRN
  Administered 2020-06-16: 4 mL

## 2020-06-16 MED ORDER — OXYCODONE HCL 5 MG/5ML PO SOLN: 5.0000 mg | Freq: Once | ORAL | Status: DC | PRN

## 2020-06-16 MED ORDER — MENTHOL 3 MG MT LOZG
1.0000 | LOZENGE | OROMUCOSAL | Status: DC | PRN
  Filled 2020-06-16: qty 9

## 2020-06-16 MED ORDER — ASPIRIN EC 81 MG PO TBEC: 81.0000 mg | DELAYED_RELEASE_TABLET | ORAL | Status: DC

## 2020-06-16 MED ORDER — BISACODYL 10 MG RE SUPP
10.0000 mg | Freq: Every day | RECTAL | Status: DC | PRN
  Filled 2020-06-16: qty 1

## 2020-06-16 MED ORDER — LIDOCAINE HCL (PF) 2 % IJ SOLN
INTRAMUSCULAR | Status: AC
  Filled 2020-06-16: qty 5

## 2020-06-16 MED ORDER — ENOXAPARIN SODIUM 40 MG/0.4ML ~~LOC~~ SOLN
40.0000 mg | SUBCUTANEOUS | Status: DC
  Administered 2020-06-17: 40 mg via SUBCUTANEOUS
  Filled 2020-06-16: qty 0.4

## 2020-06-16 SURGICAL SUPPLY — 53 items
APL PRP STRL LF DISP 70% ISPRP (MISCELLANEOUS) ×1
BLADE SAGITTAL WIDE XTHICK NO (BLADE) IMPLANT
CANISTER SUCT 1200ML W/VALVE (MISCELLANEOUS) ×2 IMPLANT
CANISTER SUCT 3000ML PPV (MISCELLANEOUS) ×4 IMPLANT
CANISTER WOUND CARE 500ML ATS (WOUND CARE) ×2 IMPLANT
CHLORAPREP W/TINT 26 (MISCELLANEOUS) ×2 IMPLANT
COVER BACK TABLE REUSABLE LG (DRAPES) ×2 IMPLANT
COVER WAND RF STERILE (DRAPES) ×2 IMPLANT
DRAPE 3/4 80X56 (DRAPES) ×4 IMPLANT
DRAPE C-ARMOR (DRAPES) IMPLANT
DRAPE INCISE IOBAN 66X60 STRL (DRAPES) ×4 IMPLANT
ELECT BLADE 6.5 EXT (BLADE) ×2 IMPLANT
ELECT CAUTERY BLADE 6.4 (BLADE) ×2 IMPLANT
ELECT REM PT RETURN 9FT ADLT (ELECTROSURGICAL) ×2
ELECTRODE REM PT RTRN 9FT ADLT (ELECTROSURGICAL) ×1 IMPLANT
GLOVE SURG ORTHO LTX SZ8 (GLOVE) ×2 IMPLANT
GLOVE SURG SYN 9.0  PF PI (GLOVE) ×4
GLOVE SURG SYN 9.0 PF PI (GLOVE) ×2 IMPLANT
GLOVE SURG UNDER LTX SZ8 (GLOVE) ×2 IMPLANT
GLOVE SURG UNDER POLY LF SZ9 (GLOVE) ×2 IMPLANT
GOWN STRL REUS W/ TWL LRG LVL3 (GOWN DISPOSABLE) ×1 IMPLANT
GOWN STRL REUS W/ TWL XL LVL3 (GOWN DISPOSABLE) ×1 IMPLANT
GOWN STRL REUS W/TWL LRG LVL3 (GOWN DISPOSABLE) ×2
GOWN STRL REUS W/TWL XL LVL3 (GOWN DISPOSABLE) ×2
HEAD FEMORAL 28MM SZ S (Head) ×1 IMPLANT
HEMOSTAT SURGICEL 2X3 (HEMOSTASIS) ×4 IMPLANT
HOLDER FOLEY CATH W/STRAP (MISCELLANEOUS) ×2 IMPLANT
HOOD PEEL AWAY FLYTE STAYCOOL (MISCELLANEOUS) ×4 IMPLANT
IV NS 250ML (IV SOLUTION) ×2
IV NS 250ML BAXH (IV SOLUTION) IMPLANT
IV NS IRRIG 3000ML ARTHROMATIC (IV SOLUTION) ×2 IMPLANT
KIT PREVENA INCISION MGT 13 (CANNISTER) ×2 IMPLANT
KIT TURNOVER KIT A (KITS) ×2 IMPLANT
LINER DBL MOB SZ 0 52MM (Liner) ×1 IMPLANT
MANIFOLD NEPTUNE II (INSTRUMENTS) ×2 IMPLANT
NDL SAFETY ECLIPSE 18X1.5 (NEEDLE) ×1 IMPLANT
NEEDLE HYPO 18GX1.5 SHARP (NEEDLE) ×2
NS IRRIG 1000ML POUR BTL (IV SOLUTION) ×2 IMPLANT
PACK HIP PROSTHESIS (MISCELLANEOUS) ×2 IMPLANT
PULSAVAC PLUS IRRIG FAN TIP (DISPOSABLE) ×2
STAPLER SKIN PROX 35W (STAPLE) ×2 IMPLANT
STEM FEM STD CEMENTLESS SZ9 (Stem) ×1 IMPLANT
SUT DVC 2 QUILL PDO  T11 36X36 (SUTURE) ×2
SUT DVC 2 QUILL PDO T11 36X36 (SUTURE) ×1 IMPLANT
SUT ETHIBOND #5 BRAIDED 30INL (SUTURE) IMPLANT
SUT V-LOC 90 ABS DVC 3-0 CL (SUTURE) ×4 IMPLANT
SUT VIC AB 1 CT1 36 (SUTURE) ×2 IMPLANT
SYR 20ML LL LF (SYRINGE) ×2 IMPLANT
TIP FAN IRRIG PULSAVAC PLUS (DISPOSABLE) ×1 IMPLANT
TIP IRRIG/SUCT HIGH CAPACITY (MISCELLANEOUS) ×2 IMPLANT
TOWEL OR 17X26 4PK STRL BLUE (TOWEL DISPOSABLE) ×2 IMPLANT
TRAY FOLEY MTR SLVR 16FR STAT (SET/KITS/TRAYS/PACK) ×2 IMPLANT
WAND WEREWOLF FASTSEAL 6.0 (MISCELLANEOUS) ×1 IMPLANT

## 2020-06-16 NOTE — Anesthesia Procedure Notes (Signed)
Procedure Name: Intubation Date/Time: 06/16/2020 10:55 AM Performed by: Elton Sin, RN Pre-anesthesia Checklist: Patient identified, Emergency Drugs available, Suction available and Patient being monitored Patient Re-evaluated:Patient Re-evaluated prior to induction Oxygen Delivery Method: Circle system utilized Preoxygenation: Pre-oxygenation with 100% oxygen Induction Type: IV induction Ventilation: Mask ventilation without difficulty and Oral airway inserted - appropriate to patient size Laryngoscope Size: McGraph and 4 Grade View: Grade I Tube type: Oral Tube size: 7.5 mm Number of attempts: 1 Airway Equipment and Method: Stylet and Oral airway Placement Confirmation: ETT inserted through vocal cords under direct vision,  positive ETCO2 and breath sounds checked- equal and bilateral Secured at: 22 cm Tube secured with: Tape Dental Injury: Teeth and Oropharynx as per pre-operative assessment

## 2020-06-16 NOTE — Op Note (Signed)
06/16/2020  1:38 PM  PATIENT:  Frank [name]  59 y.o. 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  PROCEDURE:  Procedure(s): Right femoral stem revision (Right)  SURGEON: Leitha Schuller, MD  ASSISTANTS: None  ANESTHESIA:   general  EBL:  Total I/O In: 1100 [I.V.:1000; IV Piggyback:100] Out: 400 [Blood:400]  BLOOD ADMINISTERED:none  DRAINS: Incisional wound VAC   LOCAL MEDICATIONS USED:  MARCAINE    and OTHER Exparel  SPECIMEN:  No Specimen  DISPOSITION OF SPECIMEN:  N/A  COUNTS:  YES  TOURNIQUET:  * No tourniquets in log *  IMPLANTS: Medacta SMS 9 standard stem with ceramic S 28 mm head and 52 mm mpact bipolar liner  DICTATION: .Dragon Dictation patient was brought the operating room and after adequate general anesthesia was obtained the right foot was placed in the Medacta attachment and the hip prepped and draped you sterile fashion with appropriate patient identification and timeout procedure was completed.  Going through the prior scar skin and subcutaneous tissue incised in the tensor fascia incised there was scar tissue the tensor could be elevated off its fascia and retracted posteriorly the interval developed and the anterior capsule exposed with a capsulotomy performed and deep retractor placed.  The metal implant could be identified there is extensive scar tissue nothing that appeared infectious.  After excising scar tissue the and applying traction through the boot the head could be disassembled from the stem and the hip externally rotated posterior capsule release carried out to allow for extension of the leg and exposure of the proximal femur.  Scar tissue at the bone were removed around the lateral aspect of the stem to prevent fracture on removal of the stem femoral stem extractor  was attached and with impaction the stem came out with out difficulty.  The prior both bipolar head was then removed and sequential broaching carried out on the femoral side with the SMS system.  Up to with multiple trials being placed during the procedure and ended up being a size 9 it was very tight proximally and had good fill with S head trial fit well.  X-rays used during the procedure with C arm to assess pre and post procedure anatomy.  The after final trials the 9 SMS stem was impacted with the ceramic S 28 head attached to the 52 liner this was attached to the stem and then the hip was reduced without difficulty.  Fibrillar was placed along the medial neck and posterior capsule there was femoral bleeding from the scar tissue during the procedure.  The wound was thoroughly irrigated prior to this Exparel and Marcaine were infiltrated in the periarticular tissues throughout the case.  The fascia was repaired using a heavy Quill suture with 3 OV lock subcutaneously and skin staples followed by incisional wound VAC.  PLAN OF CARE: Admit to inpatient   PATIENT DISPOSITION:  PACU - hemodynamically stable.

## 2020-06-16 NOTE — Transfer of Care (Signed)
Immediate Anesthesia Transfer of Care Note  Patient: Frank [name]  Procedure(s) Performed: Right femoral stem revision (Right Hip)  Patient Location: PACU  Anesthesia Type:General  Level of Consciousness: awake  Airway & Oxygen Therapy: Patient Spontanous Breathing and Patient connected to face mask oxygen  Post-op Assessment: Report given to RN and Post -op Vital signs reviewed and stable  Post vital signs: Reviewed and stable  Last Vitals:  Vitals Value Taken Time  BP 131/71 06/16/20 1333  Temp    Pulse 80 06/16/20 1338  Resp 9 06/16/20 1338  SpO2 100 % 06/16/20 1338  Vitals shown include unvalidated device data.  Last Pain:  Vitals:   06/16/20 0840  TempSrc: Temporal  PainSc: 8          Complications: No complications documented.

## 2020-06-16 NOTE — Anesthesia Preprocedure Evaluation (Addendum)
Anesthesia Evaluation  Patient identified by MRN, date of birth, ID band Patient awake    Reviewed: Allergy & Precautions, H&P , NPO status , Patient's Chart, lab work & pertinent test results  History of Anesthesia Complications Negative for: history of anesthetic complications  Airway Mallampati: II  TM Distance: >3 FB Neck ROM: full    Dental  (+) Teeth Intact   Pulmonary neg pulmonary ROS, neg sleep apnea, neg COPD,    breath sounds clear to auscultation       Cardiovascular (-) angina(-) Past MI and (-) Cardiac Stents negative cardio ROS  (-) dysrhythmias  Rhythm:regular Rate:Normal     Neuro/Psych negative neurological ROS  negative psych ROS   GI/Hepatic Neg liver ROS, GERD  ,  Endo/Other  diabetes (on metformin), Type 2  Renal/GU      Musculoskeletal   Abdominal   Peds  Hematology negative hematology ROS (+)   Anesthesia Other Findings Past Medical History: No date: Diabetes mellitus without complication (HCC)     Comment:  type II No date: GERD (gastroesophageal reflux disease)  Past Surgical History: No date: JOINT REPLACEMENT No date: ROTATOR CUFF REPAIR  BMI    Body Mass Index: 23.18 kg/m      Reproductive/Obstetrics negative OB ROS                            Anesthesia Physical Anesthesia Plan  ASA: II  Anesthesia Plan: General ETT   Post-op Pain Management:    Induction:   PONV Risk Score and Plan: Ondansetron, Dexamethasone, Treatment may vary due to age or medical condition and Midazolam  Airway Management Planned:   Additional Equipment:   Intra-op Plan:   Post-operative Plan:   Informed Consent: I have reviewed the patients History and Physical, chart, labs and discussed the procedure including the risks, benefits and alternatives for the proposed anesthesia with the patient or authorized representative who has indicated his/her understanding and  acceptance.     Dental Advisory Given  Plan Discussed with: Anesthesiologist, CRNA and Surgeon  Anesthesia Plan Comments:         Anesthesia Quick Evaluation

## 2020-06-16 NOTE — H&P (Addendum)
Chief Complaint  Patient presents with  . Follow-up  S/P Bilateral THA, AVN   History of the Present Illness: Frank [name] is a 60 y.o. male here today.   The patient presents for evaluation of bilateral hip pain. The patient underwent bilateral total hip arthroplasties several years ago, right total hip arthroplasty in 2015, and left total hip arthroplasty in 2016, and had avascular necrosis. He comes in for evaluation. He has seen a physician at [facility]. He now has pain that is 8 out of 10. He has had evaluation with lab work at [facility] that were negative for infection, and negative cultures.  The patient states his left hip pain is not too bad.  The patient has diabetes, and his A1c was within normal limits. He takes metformin.  The patient is employed at [employer].   I have reviewed past medical, surgical, social and family history, and allergies as documented in the EMR.  Past Medical History: Past Medical History:  Diagnosis Date  . Osteoarthritis  Bilateral hips.  . Shingles   Past Surgical History: Past Surgical History:  Procedure Laterality Date  . COLONOSCOPY 2012  . JOINT REPLACEMENT Left 02/23/2012  Left hip total hip replacement.  . Right rotator cuff repair  . Right total hip replacement, anterior approach Right 09/24/13   Past Family History: Family History  Problem Relation Age of Onset  . Diabetes type II Mother  . Colon cancer Father  . Diabetes type II Sister  . Seizures Sister  . Kidney disease Sister  . Diabetes type II Brother   Medications: Current Outpatient Medications Ordered in Epic  Medication Sig Dispense Refill  . acetaminophen (TYLENOL) 325 MG tablet Take 1 tablet by mouth every 4 (four) hours as needed  . aspirin 81 MG EC tablet Take 81 mg by mouth once daily.  Marland Kitchen atorvastatin (LIPITOR) 40 MG tablet Take 1 tablet by mouth once daily  . HYDROcodone-acetaminophen (NORCO) 5-325 mg tablet Take 1 tablet by mouth every 6  (six) hours as needed for Pain. 60 tablet 0  . meloxicam (MOBIC) 7.5 MG tablet Take 7.5 mg by mouth 2 (two) times daily.  . metFORMIN (GLUCOPHAGE) 500 MG tablet Take 1 tablet by mouth 2 (two) times daily   No current Epic-ordered facility-administered medications on file.   Allergies: No Known Allergies   Body mass index is 22.53 kg/m.  Review of Systems: A comprehensive 14 point ROS was performed, reviewed, and the pertinent orthopaedic findings are documented in the HPI.  There were no vitals filed for this visit.   General Physical Examination:   General/Constitutional: No apparent distress: well-nourished and well developed. Eyes: Pupils equal, round with synchronous movement. Lungs: Clear to auscultation HEENT: Normal Vascular: No edema, swelling or tenderness, except as noted in detailed exam. Cardiac: Heart rate and rhythm is regular. Integumentary: No impressive skin lesions present, except as noted in detailed exam. Neuro/Psych: Normal mood and affect, oriented to person, place and time.  Musculoskeletal Examination:  On exam, the patient has an antalgic gait. Right hip range of motion of 0-20 degrees, limited by pain.  Radiographs:  AP pelvis and lateral x-rays of the right hip were ordered and personally reviewed today. These show gross loosening of right hip stem. There is obvious potting distally with pedestal formation, and obvious micromotion causing defect in the proximal femur. Both acetabulum look okay. Less bony ingrowth on the left than the right, and there is some evidence of potting on the  left as well, but less severe.  X-ray Impression Bilateral stem pedestal formation and potting on the right hip.  Assessment: ICD-10-CM  1. 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 Avascular necrosis, right hip M87.00  3. 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 AVN (avascular necrosis of bone)  (CMS-HCC) M87.00   Plan:  The patient has clinical findings of bilateral stem pedestal formation and potting on the right hip.  We discussed the patient's x-ray findings. I explained his right hip has gross loosening of the stem. I recommend right hip revision. I explained the surgery and postoperative course in detail..  Surgery scheduled for end of March.  Surgical Risks:  The nature of the condition and the proposed procedure has been reviewed in detail with the patient. Surgical versus non-surgical options and prognosis for recovery have been reviewed and the inherent risks and benefits of each have been discussed including the risks of infection, bleeding, injury to nerves/blood vessels/tendons, incomplete relief of symptoms, persisting pain and/or stiffness, loss of function, complex regional pain syndrome, failure of the procedure, as appropriate.  Attestation: I, Dawn Royse, am documenting for [employer], MD utilizing Nuance DAX.     Electronically signed by Marlena Clipper, MD at 06/08/20  Reviewed  H+P. No changes noted.

## 2020-06-17 ENCOUNTER — Encounter: Payer: Self-pay | Admitting: Orthopedic Surgery

## 2020-06-17 LAB — CBC
HCT: 30.3 % — ABNORMAL LOW (ref 39.0–52.0)
Hemoglobin: 9.8 g/dL — ABNORMAL LOW (ref 13.0–17.0)
MCH: 30.6 pg (ref 26.0–34.0)
MCHC: 32.3 g/dL (ref 30.0–36.0)
MCV: 94.7 fL (ref 80.0–100.0)
Platelets: 203 10*3/uL (ref 150–400)
RBC: 3.2 MIL/uL — ABNORMAL LOW (ref 4.22–5.81)
RDW: 11.9 % (ref 11.5–15.5)
WBC: 8.9 10*3/uL (ref 4.0–10.5)
nRBC: 0 % (ref 0.0–0.2)

## 2020-06-17 LAB — BASIC METABOLIC PANEL
Anion gap: 5 (ref 5–15)
BUN: 13 mg/dL (ref 6–20)
CO2: 24 mmol/L (ref 22–32)
Calcium: 8.4 mg/dL — ABNORMAL LOW (ref 8.9–10.3)
Chloride: 106 mmol/L (ref 98–111)
Creatinine, Ser: 1.01 mg/dL (ref 0.61–1.24)
GFR, Estimated: >60 mL/min (ref >60)
Glucose, Bld: 148 mg/dL — ABNORMAL HIGH (ref 70–99)
Potassium: 4 mmol/L (ref 3.5–5.1)
Sodium: 135 mmol/L (ref 135–145)

## 2020-06-17 LAB — GLUCOSE, CAPILLARY
Glucose-Capillary: 172 mg/dL — ABNORMAL HIGH (ref 70–99)
Glucose-Capillary: 139 mg/dL — ABNORMAL HIGH (ref 70–99)

## 2020-06-17 MED ORDER — ENOXAPARIN SODIUM 40 MG/0.4ML ~~LOC~~ SOLN: 40.0000 mg | SUBCUTANEOUS | 0 refills | Status: AC

## 2020-06-17 MED ORDER — TRAMADOL HCL 50 MG PO TABS: 50.0000 mg | ORAL_TABLET | Freq: Four times a day (QID) | ORAL | 0 refills | Status: AC | PRN

## 2020-06-17 MED ORDER — OXYCODONE HCL 5 MG PO TABS: 5.0000 mg | ORAL_TABLET | ORAL | 0 refills | Status: AC | PRN

## 2020-06-17 MED ORDER — METHOCARBAMOL 500 MG PO TABS: 500.0000 mg | ORAL_TABLET | Freq: Four times a day (QID) | ORAL | 0 refills | Status: AC | PRN

## 2020-06-17 NOTE — Progress Notes (Signed)
Met with the patient at the bedside He has a rolling walker at home He is agreeable to go to outpatient PT No HH agency will accept due to being in [facility] or the high copay with [employer] The patient is aware Made referral to Outpatient PT, they will call with an appointment

## 2020-06-17 NOTE — Progress Notes (Signed)
Physical Therapy Treatment Patient Details Name: Frank [name] MRN: [id number] DOB: [birth year] Today's Date: 06/17/2020    History of Present Illness Pt is a 60 y.o. 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    PT Comments    Pt resting in recliner upon PT arrival; agreeable to PT session.  Pt modified independent with transfers; modified independent with ambulation 250 feet with RW; and CGA navigating steps with UE support.  Pt steady and safe with functional mobility during noted sessions activities.  Pain 5/10 R hip beginning of session but 4/10 at rest end of session.  Pt appearing safe to discharge home in terms of functional mobility; nurse notified.  Plan for OP PT set-up and discharge today.    Follow Up Recommendations  Outpatient PT     Equipment Recommendations   (pt has RW and BSC at home already)    Recommendations for Other Services OT consult     Precautions / Restrictions Precautions Precautions: Anterior Hip;Fall Precaution Booklet Issued: Yes (comment) Precaution Comments: wound vac Restrictions Weight Bearing Restrictions: Yes RLE Weight Bearing: Weight bearing as tolerated    Mobility  Bed Mobility                Transfers Overall transfer level: Modified independent Equipment used: Rolling walker (2 wheeled) Transfers: Sit to/from [employer] Sit to Stand: Modified independent (Device/Increase time) Stand pivot transfers: Modified independent (Device/Increase time)       General transfer comment: pt still using a lot of UE support for transfers (limiting hip flexion during transitions d/t pain) but pt performing it safely  Ambulation/Gait Ambulation/Gait assistance: Modified independent (Device/Increase time) Gait Distance (Feet): 250 Feet Assistive device: Rolling walker (2 wheeled)   Gait  velocity: mildly decreased   General Gait Details: mildly antalgic; mild decreased stance time R LE; partial step through gait pattern; steady   Stairs Stairs: Yes Stairs assistance: Min guard Stair Management: One rail Left;Step to pattern;Forwards Number of Stairs:  (4x2 trials) General stair comments: ascended 4 steps x2 with L railing step to pattern forward facing; descended 4 steps with B railings step to pattern (pt unable to perform single rail with single UE support); descended 4 steps with single railing step to pattern (B UE support) safely   Wheelchair Mobility    Modified Rankin (Stroke Patients Only)       Balance Overall balance assessment: Needs assistance Sitting-balance support: No upper extremity supported;Feet supported Sitting balance-Leahy Scale: Normal Sitting balance - Comments: steady sitting reaching outside BOS   Standing balance support: No upper extremity supported Standing balance-Leahy Scale: Good Standing balance comment: steady standing reaching within BOS                            Cognition Arousal/Alertness: Awake/alert Behavior During Therapy: WFL for tasks assessed/performed Overall Cognitive Status: Within Functional Limits for tasks assessed                                        Exercises    General Comments General comments (skin integrity, edema, etc.): wound vac in place.  Pt agreeable to PT session.      Pertinent Vitals/Pain Pain Assessment: 0-10 Pain Score: 4  Pain Location: R hip Pain Descriptors / Indicators: Sore Pain Intervention(s): Limited activity within patient's tolerance;Monitored during session;Premedicated before session;Repositioned;Ice applied  Vitals (HR and O2 on room air) stable and WFL throughout treatment session.    Home Living Family/patient expects to be discharged to:: Private residence Living Arrangements: Other relatives (pt's mother; pt's sister plans to stay with  after surgery to assist) Available Help at Discharge: Family Type of Home: House Home Access: Stairs to enter Entrance Stairs-Rails: Left Home Layout: One level Home Equipment: Environmental consultant - 2 wheels;Cane - single point;Bedside commode      Prior Function Level of Independence: Independent with assistive device(s)      Comments: Modified independent ambulating with SPC; works at [employer]; no falls in past 6 months   PT Goals (current goals can now be found in the care plan section) Acute Rehab PT Goals Patient Stated Goal: To return home PT Goal Formulation: With patient Time For Goal Achievement: 07/01/20 Potential to Achieve Goals: Good Progress towards PT goals: Progressing toward goals    Frequency    BID      PT Plan Current plan remains appropriate    Co-evaluation              AM-PAC PT "6 Clicks" Mobility   Outcome Measure  Help needed turning from your back to your side while in a flat bed without using bedrails?: None Help needed moving from lying on your back to sitting on the side of a flat bed without using bedrails?: None Help needed moving to and from a bed to a chair (including a wheelchair)?: None Help needed standing up from a chair using your arms (e.g., wheelchair or bedside chair)?: None Help needed to walk in hospital room?: None Help needed climbing 3-5 steps with a railing? : A Little 6 Click Score: 23    End of Session Equipment Utilized During Treatment: Gait belt Activity Tolerance: Patient tolerated treatment well Patient left: in chair;with call bell/phone within reach;with chair alarm set;with SCD's reapplied;Other (comment) (B heels floating via pillow support) Nurse Communication: Mobility status;Precautions;Weight bearing status PT Visit Diagnosis: Other abnormalities of gait and mobility (R26.89);Muscle weakness (generalized) (M62.81);Pain Pain - Right/Left: Right Pain - part of body: Hip     Time: 8938-1017 PT Time  Calculation (min) (ACUTE ONLY): 23 min  Charges:  $Gait Training: 23-37 mins                    [employer], PT 06/17/20, 2:30 PM

## 2020-06-17 NOTE — Evaluation (Signed)
Occupational Therapy Evaluation Patient Details Name: Frank [name] MRN: [id number] DOB: [birth year] Today's Date: 06/17/2020    History of Present Illness Pt is a 60 y.o. 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   Clinical Impression   Mr Soules was seen for OT evaluation this date. Prior to hospital admission, pt was MOD I for mobility and ADLs using SPC, pt works at [employer]. Pt lives c his mother in 1 level home with 4 STE, reports his sister will stay with him after discharge. Pt presents to acute OT demonstrating impaired ADL performance and functional mobility 2/2 decreased activity tolerance and functional strength/ROM deficits. Pt currently requires MOD A for compression stockings mgmt seated EOB. SBA + RW for standing grooming tasks and toilet t/f - inclusing reaching outside BOS with no LOBs. Pt would benefit from skilled OT to address noted impairments and functional limitations (see below for any additional details) in order to maximize safety and independence while minimizing falls risk and caregiver burden. Upon hospital discharge, anticipate no OT follow up required.     Follow Up Recommendations  No OT follow up;Supervision - Intermittent    Equipment Recommendations  None recommended by OT    Recommendations for Other Services       Precautions / Restrictions Precautions Precautions: Anterior Hip;Fall Precaution Booklet Issued: Yes (comment) Precaution Comments: wound vac Restrictions Weight Bearing Restrictions: Yes RLE Weight Bearing: Weight bearing as tolerated      Mobility Bed Mobility Overal bed mobility: Modified Independent             General bed mobility comments: Supine to/from sit with mild increased effort/time to perform on own    Transfers Overall transfer level: Needs assistance Equipment used: Rolling walker (2  wheeled) Transfers: Sit to/from Stand Sit to Stand: Supervision          Balance Overall balance assessment: Needs assistance Sitting-balance support: No upper extremity supported;Feet supported Sitting balance-Leahy Scale: Normal Sitting balance - Comments: steady sitting reaching outside BOS   Standing balance support: No upper extremity supported Standing balance-Leahy Scale: Good Standing balance comment: steady standing reaching within BOS                           ADL either performed or assessed with clinical judgement   ADL Overall ADL's : Needs assistance/impaired                                       General ADL Comments: MOD A for compression stockings mgmt seated EOB. SBA + RW for standing grooming tasks and toilet t/f - inclusing reaching outside BOS with no LOBs.                  Pertinent Vitals/Pain Pain Assessment: 0-10 Pain Score: 3  Pain Location: R hip Pain Descriptors / Indicators: Sore Pain Intervention(s): Limited activity within patient's tolerance;Monitored during session;Repositioned;Ice applied     Hand Dominance Right   Extremity/Trunk Assessment Upper Extremity Assessment Upper Extremity Assessment: Overall WFL for tasks assessed   Lower Extremity Assessment Lower Extremity Assessment: RLE deficits/detail (L LE WFL) RLE Deficits / Details: hip flexion 3-/5 (limited d/t R hip pain); at least 3/5 AROM knee flexion/extension and DF/PF RLE:  Unable to fully assess due to pain   Cervical / Trunk Assessment Cervical / Trunk Assessment: Normal   Communication Communication Communication: No difficulties   Cognition Arousal/Alertness: Awake/alert Behavior During Therapy: WFL for tasks assessed/performed Overall Cognitive Status: Within Functional Limits for tasks assessed                                     General Comments  wound vac in place/appearing intact beginning/end of session     Exercises Exercises: Other exercises Other Exercises Other Exercises: Pt educated re: OT role, DME recs, d/c recs, falls prevention, ECS Other Exercises: LBD, grooming, sup>sit, sit<>stand, sitting/standing balance/tolerance, ~20 ft mobility   Shoulder Instructions      Home Living Family/patient expects to be discharged to:: Private residence Living Arrangements: Other relatives (pt's mother; pt's sister plans to stay with after surgery to assist) Available Help at Discharge: Family Type of Home: House Home Access: Stairs to enter [employer] of Steps: 3 steps with L railing plus 1 small step with L UE support Entrance Stairs-Rails: Left Home Layout: One level     Bathroom Shower/Tub: Chief Strategy Officer: Handicapped height     Home Equipment: Environmental consultant - 2 wheels;Cane - single point;Bedside commode          Prior Functioning/Environment Level of Independence: Independent with assistive device(s)        Comments: Modified independent ambulating with SPC; works at [employer]; no falls in past 6 months        OT Problem List: Decreased strength;Decreased range of motion;Decreased activity tolerance;Impaired balance (sitting and/or standing)      OT Treatment/Interventions: Self-care/ADL training;Therapeutic exercise;Energy conservation;DME and/or AE instruction;Therapeutic activities;Patient/family education;Balance training    OT Goals(Current goals can be found in the care plan section) Acute Rehab OT Goals Patient Stated Goal: To return home OT Goal Formulation: With patient Time For Goal Achievement: 07/01/20 Potential to Achieve Goals: Good ADL Goals Pt Will Perform Grooming: standing;Independently Pt Will Perform Lower Body Dressing: with modified independence;sit to/from stand (c LRAD PRN) Additional ADL Goal #1: Pt will independently verbalize plan to implement x3 falls prevention strategies.  OT Frequency: Min 1X/week               AM-PAC OT "6 Clicks" Daily Activity     Outcome Measure Help from another person eating meals?: None Help from another person taking care of personal grooming?: A Little Help from another person toileting, which includes using toliet, bedpan, or urinal?: A Little Help from another person bathing (including washing, rinsing, drying)?: A Little Help from another person to put on and taking off regular upper body clothing?: None Help from another person to put on and taking off regular lower body clothing?: A Lot 6 Click Score: 19   End of Session Equipment Utilized During Treatment: Rolling walker;Gait belt  Activity Tolerance: Patient tolerated treatment well Patient left: in chair;with call bell/phone within reach;with chair alarm set  OT Visit Diagnosis: Unsteadiness on feet (R26.81);Other abnormalities of gait and mobility (R26.89)                Time: 6578-4696 OT Time Calculation (min): 22 min Charges:  OT General Charges $OT Visit: 1 Visit OT Evaluation $OT Eval Low Complexity: 1 Low OT Treatments $Self Care/Home Management : 8-22 mins  Kathie Dike, M.S. OTR/L  06/17/20, 12:58 PM  ascom [phone]

## 2020-06-17 NOTE — Plan of Care (Signed)
Alert, oriented x4, pain around 4-5, well controlled with medication, able to move BLE, no c/o numbness or tingling. Will continue to monitor.

## 2020-06-17 NOTE — Evaluation (Signed)
Physical Therapy Evaluation Patient Details Name: Frank [name] MRN: [id number] DOB: 1961-01-25 Today's Date: 06/17/2020   History of Present Illness  Pt is a 60 y.o. 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  Clinical Impression  Prior to hospital admission, pt was modified independent ambulating with [facility]; lives with his mom; his sister plans to stay with to assist as needed upon hospital discharge.  Currently pt is modified independent with bed mobility; SBA with transfers; and CGA ambulating 200 feet with RW.  Overall pt steady and safe with functional mobility during sessions activities.  Tolerated sitting and semi-supine LE ex's fairly well with assist as needed.  Pain 1-2/10 at rest beginning of session and 3-4/10 at rest end of session (R hip).  Pt would benefit from skilled PT to address noted impairments and functional limitations (see below for any additional details).  Upon hospital discharge, pt would benefit from OP PT.  Will plan to trial stairs this afternoon.     Follow Up Recommendations Outpatient PT    Equipment Recommendations   (pt has RW and BSC at home already)    Recommendations for Other Services OT consult     Precautions / Restrictions Precautions Precautions: Anterior Hip;Fall Precaution Booklet Issued: Yes (comment) Precaution Comments: wound vac Restrictions Weight Bearing Restrictions: Yes RLE Weight Bearing: Weight bearing as tolerated      Mobility  Bed Mobility Overal bed mobility: Modified Independent             General bed mobility comments: Supine to/from sit with mild increased effort/time to perform on own    Transfers Overall transfer level: Needs assistance Equipment used: Rolling walker (2 wheeled) Transfers: Sit to/from [employer] Sit to Stand: Supervision Stand pivot transfers:  Supervision       General transfer comment: pt using a lot of UE support to initiate standing but appears steady and safe (from bed and from recliner) with RW use  Ambulation/Gait Ambulation/Gait assistance: Min guard Gait Distance (Feet): 200 Feet Assistive device: Rolling walker (2 wheeled)   Gait velocity: mildly decreased   General Gait Details: antalgic; mild decreased stance time R LE; partial step through gait pattern; steady  Information systems manager [employer] (Stroke Patients Only)       Balance Overall balance assessment: Needs assistance Sitting-balance support: No upper extremity supported;Feet supported Sitting balance-Leahy Scale: Normal Sitting balance - Comments: steady sitting reaching outside BOS   Standing balance support: No upper extremity supported Standing balance-Leahy Scale: Good Standing balance comment: steady standing reaching within BOS                             Pertinent Vitals/Pain Pain Assessment: 0-10 Pain Score: 3  Pain Location: R hip Pain Descriptors / Indicators: Sore Pain Intervention(s): Limited activity within patient's tolerance;Monitored during session;Repositioned;Ice applied  Vitals (HR and O2 on room air) stable and WFL throughout treatment session.    Home Living Family/patient expects to be discharged to:: Private residence Living Arrangements: Other relatives (pt's mother; pt's sister plans to stay with after surgery to assist) Available Help at Discharge: Family Type of Home: House Home Access: Stairs to enter Entrance Stairs-Rails: Left Entrance Stairs-Number of Steps: 3 steps with L railing  plus 1 small step with L UE support Home Layout: One level Home Equipment: Walker - 2 wheels;Cane - single point;Bedside commode      Prior Function Level of Independence: Independent with assistive device(s)         Comments: Modified independent ambulating with SPC; works at  [employer]; no falls in past 6 months     Hand Dominance   Dominant Hand: Right    Extremity/Trunk Assessment   Upper Extremity Assessment Upper Extremity Assessment: Overall WFL for tasks assessed    Lower Extremity Assessment Lower Extremity Assessment: RLE deficits/detail (L LE WFL) RLE Deficits / Details: hip flexion 3-/5 (limited d/t R hip pain); at least 3/5 AROM knee flexion/extension and DF/PF RLE: Unable to fully assess due to pain    Cervical / Trunk Assessment Cervical / Trunk Assessment: Normal  Communication   Communication: No difficulties  Cognition Arousal/Alertness: Awake/alert Behavior During Therapy: WFL for tasks assessed/performed Overall Cognitive Status: Within Functional Limits for tasks assessed                                        General Comments General comments (skin integrity, edema, etc.): wound vac in place/appearing intact beginning/end of session.  Pt agreeable to PT session.    Exercises Total Joint Exercises Ankle Circles/Pumps: AROM;Strengthening;Both;10 reps;Supine Quad Sets: AROM;Strengthening;Both;10 reps;Supine Gluteal Sets: AROM;Strengthening;Both;10 reps;Supine Towel Squeeze: AROM;Strengthening;Both;10 reps;Supine Short Arc Quad: AROM;Strengthening;Right;10 reps;Supine Heel Slides: AAROM;Strengthening;Right;10 reps;Supine Hip ABduction/ADduction: AAROM;Strengthening;Right;10 reps;Supine Long Arc Quad: AROM;Strengthening;Right;10 reps;Seated General Exercises - Lower Extremity Hip Flexion/Marching: AROM;Strengthening;Right;10 reps;Seated (decreased AROM noted)   Assessment/Plan    PT Assessment Patient needs continued PT services  PT Problem List Decreased strength;Decreased activity tolerance;Decreased balance;Decreased mobility;Decreased knowledge of use of DME;Decreased knowledge of precautions;Pain;Decreased skin integrity       PT Treatment Interventions DME instruction;Gait training;Stair  training;Functional mobility training;Therapeutic activities;Therapeutic exercise;Balance training;Patient/family education    PT Goals (Current goals can be found in the Care Plan section)  Acute Rehab PT Goals Patient Stated Goal: To return home PT Goal Formulation: With patient Time For Goal Achievement: 07/01/20 Potential to Achieve Goals: Good    Frequency BID   Barriers to discharge        Co-evaluation               AM-PAC PT "6 Clicks" Mobility  Outcome Measure Help needed turning from your back to your side while in a flat bed without using bedrails?: None Help needed moving from lying on your back to sitting on the side of a flat bed without using bedrails?: None Help needed moving to and from a bed to a chair (including a wheelchair)?: A Little Help needed standing up from a chair using your arms (e.g., wheelchair or bedside chair)?: A Little Help needed to walk in hospital room?: A Little Help needed climbing 3-5 steps with a railing? : A Little 6 Click Score: 20    End of Session Equipment Utilized During Treatment: Gait belt Activity Tolerance: Patient tolerated treatment well Patient left: in chair;with call bell/phone within reach;with chair alarm set;with SCD's reapplied;Other (comment) (B heels floating via pillow support) Nurse Communication: Mobility status;Precautions;Weight bearing status;Other (comment) (pt's pain status) PT Visit Diagnosis: Other abnormalities of gait and mobility (R26.89);Muscle weakness (generalized) (M62.81);Pain Pain - Right/Left: Right Pain - part of body: Hip    Time: 6948-5462 PT Time Calculation (min) (ACUTE ONLY): 42 min  Charges:   PT Evaluation $PT Eval Low Complexity: 1 Low PT Treatments $Gait Training: 8-22 mins $Therapeutic Exercise: 23-37 mins       Hendricks Limes, PT 06/17/20, 12:35 PM

## 2020-06-17 NOTE — Discharge Summary (Signed)
Physician Discharge Summary  Patient ID: Frank [name] MRN: [id number] DOB/AGE: [birth year] 60 y.o.  Admit date: 06/16/2020 Discharge date: 06/17/2020  Admission Diagnoses:  Status post revision of total hip [Z96.649]   Discharge Diagnoses: Patient Active Problem List   Diagnosis Date Noted  . Status post revision of total hip 06/16/2020    Past Medical History:  Diagnosis Date  . 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 GERD (gastroesophageal reflux disease)      Transfusion: none   Consultants (if any):   Discharged Condition: Improved  Hospital Course: JOSEHUA HAMMAR is an 60 y.o. male who was admitted 06/16/2020 with a diagnosis of <principal problem not specified> and went to the operating room on 06/16/2020 and underwent the above named procedures.    Surgeries: Procedure(s): Right femoral stem revision on 06/16/2020 Patient tolerated the surgery well. Taken to PACU where she was stabilized and then transferred to the orthopedic floor.  Started on Lovenox 40 mg q 24 hrs. Foot pumps applied bilaterally at 80 mm. Heels elevated on bed with rolled towels. No evidence of DVT. Negative Homan. Physical therapy started on day #1 for gait training and transfer. OT started day #1 for ADL and assisted devices.  Patient's foley was d/c on day #1. Patient's IV was d/c on day #1.  On post op day #1 patient was stable and ready for discharge to home with HHPT.  Implants: Medacta SMS 9 standard stem with ceramic S 28 mm head and 52 mm mpact bipolar liner  He was given perioperative antibiotics:  Anti-infectives (From admission, onward)   Start     Dose/Rate Route Frequency Ordered Stop   06/16/20 1700  ceFAZolin (ANCEF) IVPB 2g/100 mL premix        2 g 200 mL/hr over 30 Minutes Intravenous Every 6 hours 06/16/20 1613 06/17/20 0006   06/16/20 0835  ceFAZolin (ANCEF) 2-4 GM/100ML-% IVPB       Note to Pharmacy: Brain Hilts   : cabinet override       06/16/20 0835 06/16/20 2044   06/16/20 0600  ceFAZolin (ANCEF) IVPB 2g/100 mL premix        2 g 200 mL/hr over 30 Minutes Intravenous On call to O.R. 06/15/20 2302 06/16/20 1102    .  He was given sequential compression devices, early ambulation, and lovenox TEDS for DVT prophylaxis.  He benefited maximally from the hospital stay and there were no complications.    Recent vital signs:  Vitals:   06/17/20 0441 06/17/20 0735  BP: 102/62 111/68  Pulse: 77 66  Resp: 18 18  Temp: 98.1 F (36.7 C) 98.1 F (36.7 C)  SpO2: 98% 99%    Recent laboratory studies:  Lab Results  Component Value Date   HGB 9.8 (L) 06/17/2020   HGB 11.8 (L) 06/16/2020   HGB 12.9 (L) 06/12/2020   Lab Results  Component Value Date   WBC 8.9 06/17/2020   PLT 203 06/17/2020   Lab Results  Component Value Date   INR 1.0 09/12/2013   Lab Results  Component Value Date   NA 135 06/17/2020   K 4.0 06/17/2020   CL 106 06/17/2020   CO2 24 06/17/2020   BUN 13 06/17/2020   CREATININE 1.01 06/17/2020   GLUCOSE 148 (H) 06/17/2020    Discharge Medications:   Allergies as of 06/17/2020   No Known Allergies     Medication List    STOP taking these medications  aspirin 81 MG EC tablet     TAKE these medications   acetaminophen 500 MG tablet Commonly known as: TYLENOL Take 500 mg by mouth every 6 (six) hours as needed for moderate pain or mild pain.   atorvastatin 40 MG tablet Commonly known as: LIPITOR Take 1 tablet by mouth daily.   enoxaparin 40 MG/0.4ML injection Commonly known as: LOVENOX Inject 0.4 mLs (40 mg total) into the skin daily for 14 days.   metFORMIN 500 MG tablet Commonly known as: GLUCOPHAGE Take 500 mg by mouth daily.   methocarbamol 500 MG tablet Commonly known as: ROBAXIN Take 1 tablet (500 mg total) by mouth every 6 (six) hours as needed for muscle spasms.   oxyCODONE 5 MG immediate release tablet Commonly known as: Oxy IR/ROXICODONE Take 1-2 tablets (5-10 mg  total) by mouth every 4 (four) hours as needed for moderate pain (pain score 4-6).   traMADol 50 MG tablet Commonly known as: ULTRAM Take 1 tablet (50 mg total) by mouth every 6 (six) hours as needed.       Diagnostic Studies: DG HIP OPERATIVE UNILAT W OR W/O PELVIS RIGHT  Result Date: 06/16/2020 CLINICAL DATA:  Elective surgery. EXAM: OPERATIVE RIGHT HIP (WITH PELVIS IF PERFORMED) TECHNIQUE: Fluoroscopic spot image(s) were submitted for interpretation post-operatively. COMPARISON:  Preoperative radiograph 09/24/2013 FINDINGS: Two fluoroscopic spot images obtained of the right hip in the operating room. Right hip arthroplasty is in place. Total fluoroscopy time 12 seconds. Total dose not provided. IMPRESSION: Two fluoroscopic spot views of the right hip obtained in the operating room during right hip surgery. Electronically Signed   By: Narda Rutherford M.D.   On: 06/16/2020 15:06   DG HIP UNILAT W OR W/O PELVIS 2-3 VIEWS RIGHT  Result Date: 06/16/2020 CLINICAL DATA:  Right hip replacement EXAM: DG HIP (WITH OR WITHOUT PELVIS) 2-3V RIGHT COMPARISON:  None. FINDINGS: Interval revision right total hip arthroplasty. No fracture or dislocation. Normal alignment. No hardware failure or complication. Postsurgical changes in the surrounding soft tissues. IMPRESSION: 1. Interval revision right total hip arthroplasty. Electronically Signed   By: Elige Ko   On: 06/16/2020 14:32    Disposition:      Follow-up Information    Evon Slack, PA-C Follow up in 2 week(s).   Specialties: Orthopedic Surgery, Emergency Medicine Contact information: [address] [place] [address] [phone]                Signed: Patience Musca 06/17/2020, 7:49 AM

## 2020-06-17 NOTE — Progress Notes (Signed)
   Subjective: 1 Day Post-Op Procedure(s) (LRB): Right femoral stem revision (Right) Patient reports pain as mild.   Patient is well, and has had no acute complaints or problems Denies any CP, SOB, ABD pain. We will continue therapy today.  Plan is to go Home after hospital stay.  Objective: Vital signs in last 24 hours: Temp:  [97.9 F (36.6 C)-99 F (37.2 C)] 98.1 F (36.7 C) (03/30 0735) Pulse Rate:  [66-100] 66 (03/30 0735) Resp:  [10-19] 18 (03/30 0735) BP: (102-149)/(59-90) 111/68 (03/30 0735) SpO2:  [98 %-100 %] 99 % (03/30 0735) Weight:  [67.1 kg] 67.1 kg (03/29 0840)  Intake/Output from previous day: 03/29 0701 - 03/30 0700 In: 4081.4 [P.O.:440; I.V.:3496.5; IV Piggyback:144.9] Out: 1375 [Urine:975; Blood:400] Intake/Output this shift: No intake/output data recorded.  Recent Labs    06/16/20 1703 06/17/20 0356  HGB 11.8* 9.8*   Recent Labs    06/16/20 1703 06/17/20 0356  WBC 10.6* 8.9  RBC 3.85* 3.20*  HCT 36.3* 30.3*  PLT 234 203   Recent Labs    06/16/20 1703 06/17/20 0356  NA  --  135  K  --  4.0  CL  --  106  CO2  --  24  BUN  --  13  CREATININE 0.96 1.01  GLUCOSE  --  148*  CALCIUM  --  8.4*   No results for input(s): LABPT, INR in the last 72 hours.  EXAM General - Patient is Alert, Appropriate and Oriented Extremity - Neurovascular intact Sensation intact distally Intact pulses distally Dorsiflexion/Plantar flexion intact No cellulitis present Compartment soft Dressing - dressing C/D/I and no drainage, prevena intact with out drainage Motor Function - intact, moving foot and toes well on exam.   Past Medical History:  Diagnosis Date  . 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 GERD (gastroesophageal reflux disease)     Assessment/Plan:   1 Day Post-Op Procedure(s) (LRB): Right femoral stem revision (Right) Active Problems:   Status post revision of total hip  Estimated body mass index is 23.18 kg/m as  calculated from the following:   Height as of this encounter: 5\' 7"  (1.702 m).   Weight as of this encounter: 67.1 kg. Advance diet Up with therapy  Labs stable VSS Pain well controlled CM to assist with discharge to home with HHPT today pending completion of PT goals  DVT Prophylaxis - Lovenox, TED hose and SCDs Weight-Bearing as tolerated to right leg   T. , PA-C [facility] 06/17/2020, 7:45 AM

## 2020-06-17 NOTE — Anesthesia Postprocedure Evaluation (Signed)
Anesthesia Post Note  Patient: Frank [name]  Procedure(s) Performed: Right femoral stem revision (Right Hip)  Patient location during evaluation: PACU Anesthesia Type: General Level of consciousness: awake and alert Pain management: pain level controlled Vital Signs Assessment: post-procedure vital signs reviewed and stable Respiratory status: spontaneous breathing, nonlabored ventilation and respiratory function stable Cardiovascular status: blood pressure returned to baseline and stable Postop Assessment: no apparent nausea or vomiting Anesthetic complications: no   No complications documented.   Last Vitals:  Vitals:   06/17/20 0441 06/17/20 0735  BP: 102/62 111/68  Pulse: 77 66  Resp: 18 18  Temp: 36.7 C 36.7 C  SpO2: 98% 99%    Last Pain:  Vitals:   06/17/20 0801  TempSrc:   PainSc: 4                  Karleen Hampshire

## 2020-06-17 NOTE — Discharge Instructions (Signed)

## 2020-06-17 NOTE — Progress Notes (Signed)
Pt d/c home cia POV with sister.  Prevena wound vac in place extra dressings supplied.  AVS reviewed with pt along with paper scripts and he expressed understanding.  IV removed from pt L forearm without issue.  All belongings packed up and taken at time of d/c.  VSS NAD noted.

## 2022-06-10 IMAGING — XA DG HIP (WITH PELVIS) OPERATIVE*R*
2 series · 2 of 2 positions shown · non-contrast
Comparison: Preoperative radiograph 09/24/2013

CLINICAL DATA: Elective surgery.

EXAM:
OPERATIVE RIGHT HIP (WITH PELVIS IF PERFORMED)
TECHNIQUE: Fluoroscopic spot image(s) were submitted for interpretation
post-operatively.

[Series 1: ortho standard · 1 of 1 slices shown (1 of 2)]
[im 1/1]
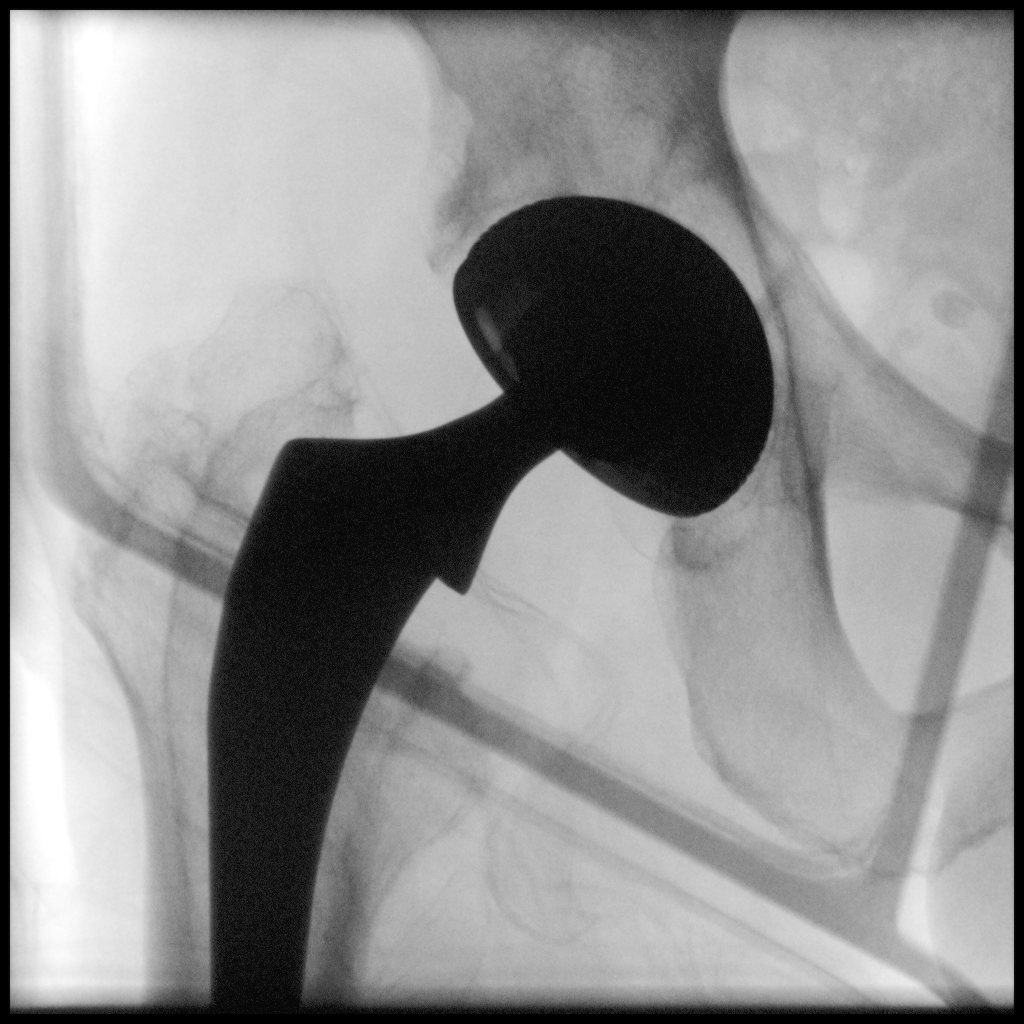

[Series 7: ortho standard · 1 of 1 slices shown (2 of 2)]
[im 1/1]
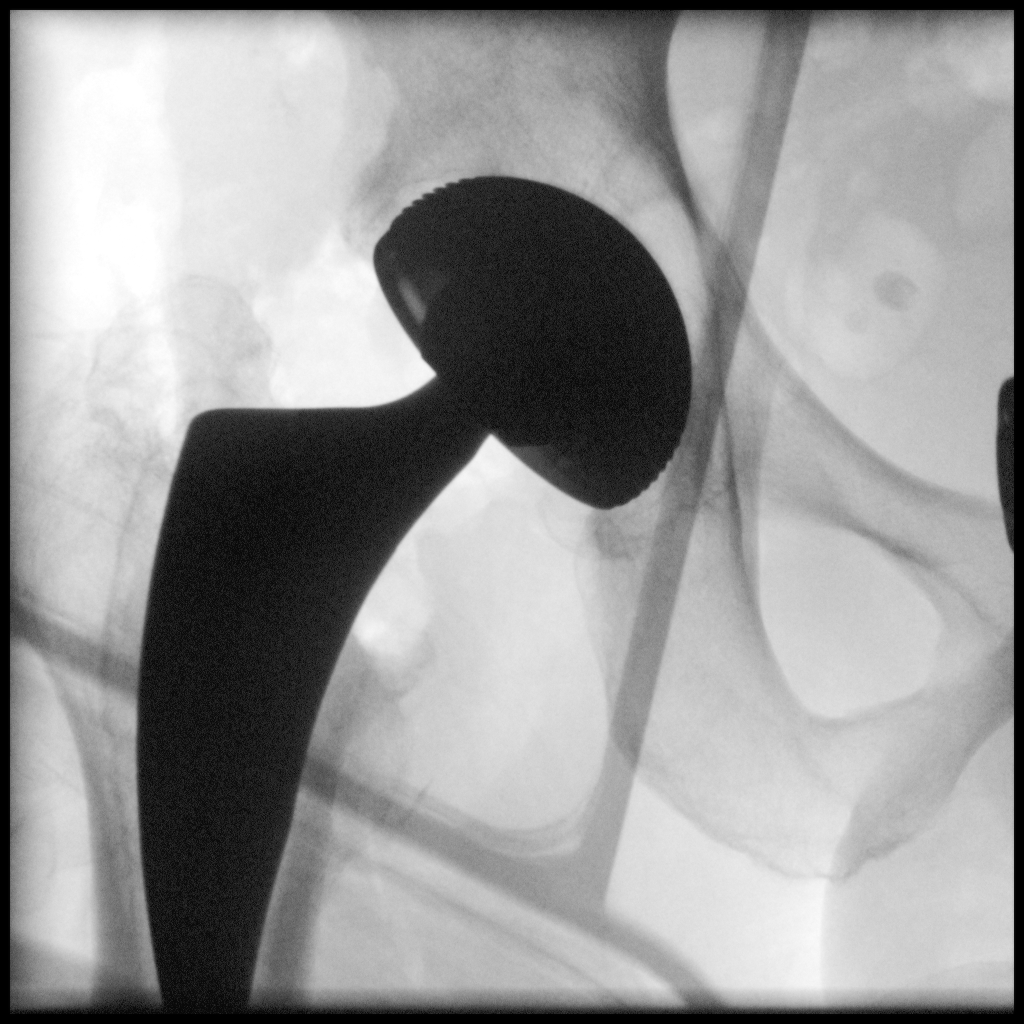

[2 of 2 positions shown; findings below may reference images not displayed]

FINDINGS: Two fluoroscopic spot images obtained of the right hip in the
operating room. Right hip arthroplasty is in place. Total
fluoroscopy time 12 seconds. Total dose not provided.
IMPRESSION: Two fluoroscopic spot views of the right hip obtained in the
operating room during right hip surgery.
